# Patient Record
Sex: Male | Born: 1945 | Race: White | Hispanic: No | Smoking: Former smoker
Health system: Southern US, Community
[De-identification: ages and names within clinical notes are randomized; demographics above are authoritative.]

## PROBLEM LIST (undated history)

## (undated) DIAGNOSIS — I219 Acute myocardial infarction, unspecified: Secondary | ICD-10-CM

## (undated) DIAGNOSIS — K219 Gastro-esophageal reflux disease without esophagitis: Secondary | ICD-10-CM

## (undated) DIAGNOSIS — I4891 Unspecified atrial fibrillation: Secondary | ICD-10-CM

## (undated) DIAGNOSIS — I10 Essential (primary) hypertension: Secondary | ICD-10-CM

## (undated) DIAGNOSIS — E119 Type 2 diabetes mellitus without complications: Secondary | ICD-10-CM

## (undated) DIAGNOSIS — F17200 Nicotine dependence, unspecified, uncomplicated: Secondary | ICD-10-CM

## (undated) DIAGNOSIS — I6529 Occlusion and stenosis of unspecified carotid artery: Secondary | ICD-10-CM

## (undated) DIAGNOSIS — I2510 Atherosclerotic heart disease of native coronary artery without angina pectoris: Secondary | ICD-10-CM

## (undated) HISTORY — PX: CARDIAC CATHETERIZATION: SHX172

## (undated) HISTORY — DX: Unspecified atrial fibrillation: I48.91

---
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
## 2005-08-10 ENCOUNTER — Ambulatory Visit: Payer: Self-pay | Admitting: Family Medicine

## 2005-09-29 ENCOUNTER — Ambulatory Visit: Payer: Self-pay | Admitting: Family Medicine

## 2005-10-09 ENCOUNTER — Ambulatory Visit: Payer: Self-pay | Admitting: Gastroenterology

## 2005-10-27 ENCOUNTER — Ambulatory Visit: Payer: Self-pay | Admitting: Gastroenterology

## 2007-10-09 ENCOUNTER — Encounter: Payer: Self-pay | Admitting: Family Medicine

## 2008-03-12 ENCOUNTER — Telehealth: Payer: Self-pay | Admitting: *Deleted

## 2008-03-20 ENCOUNTER — Telehealth: Payer: Self-pay | Admitting: Family Medicine

## 2008-04-14 ENCOUNTER — Ambulatory Visit: Payer: Self-pay | Admitting: Family Medicine

## 2008-04-14 DIAGNOSIS — F17200 Nicotine dependence, unspecified, uncomplicated: Secondary | ICD-10-CM | POA: Insufficient documentation

## 2008-04-14 DIAGNOSIS — E1159 Type 2 diabetes mellitus with other circulatory complications: Secondary | ICD-10-CM | POA: Insufficient documentation

## 2008-04-14 DIAGNOSIS — I6529 Occlusion and stenosis of unspecified carotid artery: Secondary | ICD-10-CM | POA: Insufficient documentation

## 2008-04-14 DIAGNOSIS — I10 Essential (primary) hypertension: Secondary | ICD-10-CM | POA: Insufficient documentation

## 2008-04-14 DIAGNOSIS — E785 Hyperlipidemia, unspecified: Secondary | ICD-10-CM | POA: Insufficient documentation

## 2008-04-14 HISTORY — DX: Occlusion and stenosis of unspecified carotid artery: I65.29

## 2008-04-14 HISTORY — DX: Nicotine dependence, unspecified, uncomplicated: F17.200

## 2008-04-14 LAB — CONVERTED CEMR LAB
ALT: 26 units/L (ref 0–53)
Albumin: 4.3 g/dL (ref 3.5–5.2)
BUN: 28 mg/dL — ABNORMAL HIGH (ref 6–23)
Basophils Relative: 0.3 % (ref 0.0–3.0)
Bilirubin, Direct: 0.2 mg/dL (ref 0.0–0.3)
CO2: 33 meq/L — ABNORMAL HIGH (ref 19–32)
Calcium: 10 mg/dL (ref 8.4–10.5)
Creatinine, Ser: 0.9 mg/dL (ref 0.4–1.5)
Eosinophils Relative: 1.2 % (ref 0.0–5.0)
GFR calc Af Amer: 110 mL/min
Glucose, Bld: 116 mg/dL — ABNORMAL HIGH (ref 70–99)
HCT: 45.3 % (ref 39.0–52.0)
Hemoglobin: 16.1 g/dL (ref 13.0–17.0)
Lymphocytes Relative: 24.9 % (ref 12.0–46.0)
Monocytes Absolute: 0.5 10*3/uL (ref 0.1–1.0)
Monocytes Relative: 5.3 % (ref 3.0–12.0)
Neutro Abs: 6.1 10*3/uL (ref 1.4–7.7)
PSA: 0.66 ng/mL (ref 0.10–4.00)
RBC: 4.86 M/uL (ref 4.22–5.81)
RDW: 11.7 % (ref 11.5–14.6)
TSH: 2.02 microintl units/mL (ref 0.35–5.50)
Total Protein: 7.1 g/dL (ref 6.0–8.3)

## 2008-05-05 ENCOUNTER — Ambulatory Visit: Payer: Self-pay | Admitting: Cardiology

## 2008-07-06 ENCOUNTER — Encounter: Payer: Self-pay | Admitting: Family Medicine

## 2011-01-17 NOTE — Assessment & Plan Note (Signed)
[facility]                            CARDIOLOGY OFFICE NOTE   NAME:Daniel [name], Daniel [name]                      MRN:          [id number]  DATE:05/05/2008                            DOB:          10/06/1945    PRIMARY CARE PHYSICIAN:  Tinnie Gens A. Tawanna Cooler, MD   REASON FOR PRESENTATION:  Evaluate the patient with carotid stenosis.   HISTORY OF PRESENT ILLNESS:  The patient is a 65 year old gentleman who  was seen in the past by Dr. Andee Lineman for hypertension and management of  cardiovascular risk factors.  He had been absent from Dr. Nelida Meuse care  for a while because of lack of insurance.  However, he presented  recently after a health fair.  He apparently had some mild carotid  plaque demonstrated on a carotid Doppler as part of his health fair.  He  states that his ABIs were normal.  He stated he did not have an  abdominal aneurysm.  His cholesterol by his report was okay.  He saw  Dr. Tawanna Cooler for routine followup and was referred here.  He has significant  cardiovascular risk factors.   The patient has had stress test in the past.  His last one was 3 years  ago at [facility] and he stated it was normal.  There was an exercise  treadmill test.  He has never been diagnosed with coronary artery  disease.  He has had hypertension managed.  He has continued to smoke a  couple of cigars a day.  He states that he works vigorously at his job.  He exercises 3 days a week on a treadmill.  With this level of activity,  he denies any chest discomfort, neck or arm discomfort.  He has not had  any palpitations, presyncope, or syncope.  He has had no PND or  orthopnea.   PAST MEDICAL HISTORY:  Hypertension x15 years.   PAST SURGICAL HISTORY:  Skin graft as a child.   ALLERGIES:  PENICILLIN.   MEDICATIONS:  1. Amlodipine 5 mg daily.  2. Benazepril 20 mg daily.  3. Pravastatin 20 mg daily.  4. Atenolol/chlorthalidone 50/25 daily.  5. Aspirin 325 mg daily.  6.  Multivitamin.   SOCIAL HISTORY:  The patient is a widower.  He has 2 stepchildren and 3  grandchildren.  He works in a Pension scheme manager.  He smokes cigars, but no  cigarettes.  He does not drink alcohol.   FAMILY HISTORY:  Very remarkable for early coronary artery disease.  His  father had his first heart attack at age 79 and died with heart disease  at 50.  He has had 3 brothers with heart disease in their late 48s or  early 48s.   REVIEW OF SYSTEMS:  As stated in the HPI and otherwise negative for all  other systems.   PHYSICAL EXAMINATION:  GENERAL:  The patient is pleasant and in no  distress.  VITAL SIGNS:  Heart rate 62 and regular.  HEENT:  Eyes unremarkable; pupils equal, round, and reactive to light;  fundi within normal limits; oral mucosa unremarkable.  NECK:  No jugular venous distention at 45 degrees; carotid upstroke  brisk and symmetric; no bruits, no thyromegaly.  LYMPHATICS:  No cervical, axillary, or inguinal adenopathy.  LUNGS:  Clear to auscultation bilaterally.  BACK:  No costovertebral angle tenderness.  CHEST:  Unremarkable.  HEART:  PMI not displaced or sustained; S1 and S2 within normal limits;  no S3, no S4; no clicks, no rubs, no murmurs.  ABDOMEN:  Mildly obese; positive bowel sounds, normal in frequency and  pitch; no bruits, no rebound, no guarding, no midline pulsatile mass; no  thyromegaly, no splenomegaly.  SKIN:  No rashes, no nodules.  EXTREMITIES:  Pulses 2+ throughout; no edema, no cyanosis, no clubbing.  NEUROLOGIC:  Oriented to person, place, and time; cranial nerves II  through XII grossly intact; motor grossly intact throughout.   LABORATORY DATA:  EKG sinus bradycardia, rate 51, axis within normal  limits, intervals within normal limits, no acute ST-T wave changes.   ASSESSMENT AND PLAN:  1. Carotid stenosis.  The patient did have mild carotid stenosis by      Doppler at a health fair.  I believe this to be true as I find them       fairly accurate.  I would not suggest that he has any severe      disease.  In this situation and the absence of physical findings      suggesting high-grade disease, I would pursue a followup Doppler in      1 year.  He should have aggressive risk reduction given his risk      factors.  2. Dyslipidemia.  I have suggested to him that he needs to have a      lipid profile that include a breakdown of the HDL and LDL.  With      his family history, his LDL should be less than a 829 and HDL      ideally greater than 40.  He will try to find these results for me      and we will review them.  3. Hypertension.  Blood pressure is controlled and he should continue      the medications as listed.  4. Obesity.  The patient has lost about 60 pounds through dieting over      the last year or so.  This is a wonderful effort and he is going to      continue this.  We discussed an appropriate exercise regimen as      well.  At this point, I do not think he needs further stress      testing, but would repeat an exercise treadmill test in no less      than 2 years as it would have been 5 years since the most recent.      I would do this because of his family history.  5. Followup.  We will see the patient back in 1 year for the carotid      Dopplers and in 2 years for the POET (plain old exercise      treadmill), unless he has any further symptoms or suggestion      of progressive cardiovascular problems.  I agree with Dr. Tawanna Cooler that      he needs to stop smoking.     Rollene Rotunda, MD, [facility]  Electronically Signed    JH/MedQ  DD: 05/05/2008  DT: 05/06/2008  Job #: [id number]   cc:   Tinnie Gens A. Tawanna Cooler, MD

## 2011-09-05 DIAGNOSIS — I219 Acute myocardial infarction, unspecified: Secondary | ICD-10-CM

## 2011-09-05 HISTORY — PX: CORONARY ARTERY BYPASS GRAFT: SHX141

## 2011-09-05 HISTORY — DX: Acute myocardial infarction, unspecified: I21.9

## 2011-12-01 ENCOUNTER — Other Ambulatory Visit: Payer: Self-pay

## 2011-12-01 ENCOUNTER — Encounter (HOSPITAL_COMMUNITY)
Admission: EM | Disposition: A | Discharge status: Home / Self Care | Payer: Self-pay | Source: Home / Self Care | Attending: Cardiology

## 2011-12-01 ENCOUNTER — Inpatient Hospital Stay (HOSPITAL_COMMUNITY)
Admission: EM | Admit: 2011-12-01 | Discharge: 2011-12-05 | DRG: 251 | Disposition: A | Discharge status: Home / Self Care | Payer: Medicare Other | Attending: Cardiology | Admitting: Cardiology

## 2011-12-01 ENCOUNTER — Encounter (HOSPITAL_COMMUNITY): Payer: Self-pay | Admitting: Nurse Practitioner

## 2011-12-01 ENCOUNTER — Emergency Department (HOSPITAL_COMMUNITY): Payer: Medicare Other

## 2011-12-01 DIAGNOSIS — E669 Obesity, unspecified: Secondary | ICD-10-CM

## 2011-12-01 DIAGNOSIS — F17200 Nicotine dependence, unspecified, uncomplicated: Secondary | ICD-10-CM | POA: Diagnosis present

## 2011-12-01 DIAGNOSIS — I2510 Atherosclerotic heart disease of native coronary artery without angina pectoris: Secondary | ICD-10-CM

## 2011-12-01 DIAGNOSIS — D72829 Elevated white blood cell count, unspecified: Secondary | ICD-10-CM | POA: Diagnosis present

## 2011-12-01 DIAGNOSIS — K219 Gastro-esophageal reflux disease without esophagitis: Secondary | ICD-10-CM | POA: Diagnosis present

## 2011-12-01 DIAGNOSIS — I2119 ST elevation (STEMI) myocardial infarction involving other coronary artery of inferior wall: Secondary | ICD-10-CM

## 2011-12-01 DIAGNOSIS — I214 Non-ST elevation (NSTEMI) myocardial infarction: Principal | ICD-10-CM | POA: Diagnosis present

## 2011-12-01 DIAGNOSIS — I10 Essential (primary) hypertension: Secondary | ICD-10-CM | POA: Insufficient documentation

## 2011-12-01 DIAGNOSIS — Z720 Tobacco use: Secondary | ICD-10-CM

## 2011-12-01 DIAGNOSIS — I213 ST elevation (STEMI) myocardial infarction of unspecified site: Secondary | ICD-10-CM

## 2011-12-01 DIAGNOSIS — E119 Type 2 diabetes mellitus without complications: Secondary | ICD-10-CM | POA: Insufficient documentation

## 2011-12-01 DIAGNOSIS — E785 Hyperlipidemia, unspecified: Secondary | ICD-10-CM

## 2011-12-01 HISTORY — DX: Essential (primary) hypertension: I10

## 2011-12-01 HISTORY — DX: Atherosclerotic heart disease of native coronary artery without angina pectoris: I25.10

## 2011-12-01 HISTORY — DX: Gastro-esophageal reflux disease without esophagitis: K21.9

## 2011-12-01 HISTORY — DX: Occlusion and stenosis of unspecified carotid artery: I65.29

## 2011-12-01 HISTORY — DX: Type 2 diabetes mellitus without complications: E11.9

## 2011-12-01 HISTORY — DX: Nicotine dependence, unspecified, uncomplicated: F17.200

## 2011-12-01 HISTORY — PX: LEFT HEART CATHETERIZATION WITH CORONARY ANGIOGRAM: SHX5451

## 2011-12-01 HISTORY — PX: PERCUTANEOUS CORONARY INTERVENTION-BALLOON ONLY: SHX6014

## 2011-12-01 HISTORY — DX: Acute myocardial infarction, unspecified: I21.9

## 2011-12-01 LAB — BASIC METABOLIC PANEL
BUN: 27 mg/dL — ABNORMAL HIGH (ref 6–23)
Calcium: 10.5 mg/dL (ref 8.4–10.5)
Creatinine, Ser: 1.06 mg/dL (ref 0.50–1.35)
GFR calc Af Amer: 83 mL/min — ABNORMAL LOW (ref >90)

## 2011-12-01 LAB — CBC
HCT: 45.9 % (ref 39.0–52.0)
Hemoglobin: 15.8 g/dL (ref 13.0–17.0)
MCH: 32.3 pg (ref 26.0–34.0)
MCH: 31.9 pg (ref 26.0–34.0)
MCHC: 35 g/dL (ref 30.0–36.0)
MCHC: 34.4 g/dL (ref 30.0–36.0)
MCV: 92.3 fL (ref 78.0–100.0)
Platelets: 247 10*3/uL (ref 150–400)
RBC: 4.96 MIL/uL (ref 4.22–5.81)
RDW: 12.2 % (ref 11.5–15.5)

## 2011-12-01 LAB — MRSA PCR SCREENING: MRSA by PCR: NEGATIVE

## 2011-12-01 LAB — CARDIAC PANEL(CRET KIN+CKTOT+MB+TROPI)
CK, MB: 8.4 ng/mL (ref 0.3–4.0)
Total CK: 164 U/L (ref 7–232)

## 2011-12-01 LAB — PROTIME-INR: Prothrombin Time: 13.1 seconds (ref 11.6–15.2)

## 2011-12-01 SURGERY — LEFT HEART CATHETERIZATION WITH CORONARY ANGIOGRAM
Anesthesia: LOCAL

## 2011-12-01 MED ORDER — NITROGLYCERIN 0.2 MG/ML ON CALL CATH LAB
INTRAVENOUS | Status: AC
  Filled 2011-12-01: qty 1

## 2011-12-01 MED ORDER — ALPRAZOLAM 0.25 MG PO TABS
0.2500 mg | ORAL_TABLET | Freq: Two times a day (BID) | ORAL | Status: DC | PRN
  Administered 2011-12-01 – 2011-12-04 (×5): 0.25 mg via ORAL
  Filled 2011-12-01 (×6): qty 1

## 2011-12-01 MED ORDER — NITROGLYCERIN 0.4 MG SL SUBL: 0.4000 mg | SUBLINGUAL_TABLET | SUBLINGUAL | Status: DC | PRN

## 2011-12-01 MED ORDER — ENOXAPARIN SODIUM 40 MG/0.4ML ~~LOC~~ SOLN
40.0000 mg | SUBCUTANEOUS | Status: DC
  Filled 2011-12-01: qty 0.4

## 2011-12-01 MED ORDER — ACETAMINOPHEN 325 MG PO TABS: 650.0000 mg | ORAL_TABLET | ORAL | Status: DC | PRN

## 2011-12-01 MED ORDER — INSULIN ASPART 100 UNIT/ML ~~LOC~~ SOLN
0.0000 [IU] | Freq: Three times a day (TID) | SUBCUTANEOUS | Status: DC
  Administered 2011-12-02: 2 [IU] via SUBCUTANEOUS
  Administered 2011-12-02 – 2011-12-03 (×2): 3 [IU] via SUBCUTANEOUS
  Administered 2011-12-04 (×2): 2 [IU] via SUBCUTANEOUS

## 2011-12-01 MED ORDER — LABETALOL HCL 5 MG/ML IV SOLN
INTRAVENOUS | Status: AC
  Administered 2011-12-01: 10 mg via INTRAVENOUS
  Filled 2011-12-01: qty 4

## 2011-12-01 MED ORDER — MORPHINE SULFATE 2 MG/ML IJ SOLN
2.0000 mg | INTRAMUSCULAR | Status: DC | PRN
  Administered 2011-12-01 – 2011-12-02 (×2): 2 mg via INTRAVENOUS
  Filled 2011-12-01 (×2): qty 1

## 2011-12-01 MED ORDER — METOPROLOL TARTRATE 1 MG/ML IV SOLN
5.0000 mg | Freq: Once | INTRAVENOUS | Status: AC
  Administered 2011-12-01: 5 mg via INTRAVENOUS

## 2011-12-01 MED ORDER — HEPARIN (PORCINE) IN NACL 2-0.9 UNIT/ML-% IJ SOLN
INTRAMUSCULAR | Status: AC
  Filled 2011-12-01: qty 2000

## 2011-12-01 MED ORDER — NITROGLYCERIN 0.4 MG SL SUBL
0.4000 mg | SUBLINGUAL_TABLET | SUBLINGUAL | Status: DC | PRN
  Administered 2011-12-01: 0.4 mg via SUBLINGUAL
  Filled 2011-12-01: qty 25

## 2011-12-01 MED ORDER — SODIUM CHLORIDE 0.9 % IJ SOLN: 3.0000 mL | INTRAMUSCULAR | Status: DC | PRN

## 2011-12-01 MED ORDER — FENTANYL CITRATE 0.05 MG/ML IJ SOLN
INTRAMUSCULAR | Status: AC
  Filled 2011-12-01: qty 2

## 2011-12-01 MED ORDER — LIDOCAINE HCL (PF) 1 % IJ SOLN
INTRAMUSCULAR | Status: AC
  Filled 2011-12-01: qty 30

## 2011-12-01 MED ORDER — BIVALIRUDIN 250 MG IV SOLR
INTRAVENOUS | Status: AC
  Filled 2011-12-01: qty 250

## 2011-12-01 MED ORDER — ZOLPIDEM TARTRATE 5 MG PO TABS
5.0000 mg | ORAL_TABLET | Freq: Every evening | ORAL | Status: DC | PRN
  Administered 2011-12-02 – 2011-12-04 (×3): 5 mg via ORAL
  Filled 2011-12-01 (×3): qty 1

## 2011-12-01 MED ORDER — NITROGLYCERIN IN D5W 200-5 MCG/ML-% IV SOLN
2.0000 ug/min | INTRAVENOUS | Status: AC
  Administered 2011-12-01: 10 ug/min via INTRAVENOUS

## 2011-12-01 MED ORDER — LABETALOL HCL 5 MG/ML IV SOLN
10.0000 mg | Freq: Once | INTRAVENOUS | Status: AC
  Administered 2011-12-01: 10 mg via INTRAVENOUS

## 2011-12-01 MED ORDER — ATROPINE SULFATE 1 MG/ML IJ SOLN
INTRAMUSCULAR | Status: AC
  Filled 2011-12-01: qty 1

## 2011-12-01 MED ORDER — MIDAZOLAM HCL 2 MG/2ML IJ SOLN
INTRAMUSCULAR | Status: AC
  Filled 2011-12-01: qty 2

## 2011-12-01 MED ORDER — ONDANSETRON HCL 4 MG/2ML IJ SOLN
4.0000 mg | Freq: Four times a day (QID) | INTRAMUSCULAR | Status: DC | PRN
  Filled 2011-12-01: qty 2

## 2011-12-01 MED ORDER — ASPIRIN 81 MG PO CHEW: 81.0000 mg | CHEWABLE_TABLET | Freq: Every day | ORAL | Status: DC

## 2011-12-01 MED ORDER — ATORVASTATIN CALCIUM 80 MG PO TABS
80.0000 mg | ORAL_TABLET | Freq: Every day | ORAL | Status: DC
  Administered 2011-12-02 – 2011-12-04 (×3): 80 mg via ORAL
  Filled 2011-12-01 (×4): qty 1

## 2011-12-01 MED ORDER — SODIUM CHLORIDE 0.9 % IJ SOLN
3.0000 mL | Freq: Two times a day (BID) | INTRAMUSCULAR | Status: DC
  Administered 2011-12-01 – 2011-12-02 (×2): 3 mL via INTRAVENOUS
  Administered 2011-12-03 (×2): via INTRAVENOUS
  Administered 2011-12-04 – 2011-12-05 (×3): 3 mL via INTRAVENOUS

## 2011-12-01 MED ORDER — ASPIRIN EC 81 MG PO TBEC
81.0000 mg | DELAYED_RELEASE_TABLET | Freq: Every day | ORAL | Status: DC
  Administered 2011-12-02 – 2011-12-05 (×4): 81 mg via ORAL
  Filled 2011-12-01 (×4): qty 1

## 2011-12-01 MED ORDER — HEPARIN (PORCINE) IN NACL 100-0.45 UNIT/ML-% IJ SOLN
INTRAMUSCULAR | Status: AC
  Filled 2011-12-01: qty 250

## 2011-12-01 MED ORDER — METOPROLOL TARTRATE 1 MG/ML IV SOLN
INTRAVENOUS | Status: AC
  Filled 2011-12-01: qty 5

## 2011-12-01 MED ORDER — SODIUM CHLORIDE 0.9 % IV SOLN
0.2500 mg/kg/h | INTRAVENOUS | Status: DC
  Administered 2011-12-01: 0.25 mg/kg/h via INTRAVENOUS
  Filled 2011-12-01 (×2): qty 250

## 2011-12-01 MED ORDER — SODIUM CHLORIDE 0.9 % IV SOLN: 250.0000 mL | INTRAVENOUS | Status: DC | PRN

## 2011-12-01 MED ORDER — ONDANSETRON HCL 4 MG/2ML IJ SOLN: 4.0000 mg | Freq: Four times a day (QID) | INTRAMUSCULAR | Status: DC | PRN

## 2011-12-01 MED ORDER — HEPARIN BOLUS VIA INFUSION
4000.0000 [IU] | Freq: Once | INTRAVENOUS | Status: AC
  Administered 2011-12-01: 4000 [IU] via INTRAVENOUS

## 2011-12-01 MED ORDER — CARVEDILOL 3.125 MG PO TABS
3.1250 mg | ORAL_TABLET | Freq: Two times a day (BID) | ORAL | Status: DC
  Administered 2011-12-02 – 2011-12-03 (×3): 3.125 mg via ORAL
  Filled 2011-12-01 (×5): qty 1

## 2011-12-01 MED ORDER — ATORVASTATIN CALCIUM 80 MG PO TABS
80.0000 mg | ORAL_TABLET | ORAL | Status: AC
Start: 2011-12-01 — End: 2011-12-01
  Administered 2011-12-01: 80 mg via ORAL
  Filled 2011-12-01: qty 1

## 2011-12-01 MED ORDER — NITROGLYCERIN IN D5W 200-5 MCG/ML-% IV SOLN
INTRAVENOUS | Status: AC
  Filled 2011-12-01: qty 250

## 2011-12-01 MED ORDER — DIAZEPAM 2 MG PO TABS
2.0000 mg | ORAL_TABLET | Freq: Four times a day (QID) | ORAL | Status: DC | PRN
  Administered 2011-12-01: 2 mg via ORAL
  Filled 2011-12-01: qty 1

## 2011-12-01 MED ORDER — CLOPIDOGREL BISULFATE 300 MG PO TABS
ORAL_TABLET | ORAL | Status: AC
  Filled 2011-12-01: qty 2

## 2011-12-01 MED ORDER — NITROGLYCERIN IN D5W 200-5 MCG/ML-% IV SOLN
2.0000 ug/min | Freq: Once | INTRAVENOUS | Status: AC
  Administered 2011-12-01: 5 ug/min via INTRAVENOUS

## 2011-12-01 MED ORDER — METOPROLOL TARTRATE 1 MG/ML IV SOLN
INTRAVENOUS | Status: AC
  Administered 2011-12-01: 5 mg via INTRAVENOUS
  Filled 2011-12-01: qty 5

## 2011-12-01 MED ORDER — ASPIRIN 325 MG PO TABS: 325.0000 mg | ORAL_TABLET | ORAL | Status: DC

## 2011-12-01 MED ORDER — CARVEDILOL 3.125 MG PO TABS
3.1250 mg | ORAL_TABLET | ORAL | Status: AC
  Administered 2011-12-01: 3.125 mg via ORAL
  Filled 2011-12-01: qty 1

## 2011-12-01 NOTE — H&P (Signed)
Patient ID: Irene Pap MRN: [id number], DOB/AGE: 1946-03-31   Admit date: 12/01/2011   Primary Physician: Lucina Mellow, MD - Urgent Care Primary Cardiologist: T. Riley Kill, MD  Pt. Profile:  66 y/o male w/o prior cardiac history who presents with acute inferior STEMI.  Problem List  Past Medical History  Diagnosis Date  . HTN (hypertension)   . Diabetes mellitus type 2, noninsulin dependent     a. diagnosed 2012    Allergies  Allergies  Allergen Reactions  . Penicillins     HPI  66 y/o male with the above problem list.  He has no personal h/o CAD but has signif FH and RF including obesity, tobacco abuse, htn, and DM.  He was in his USOH until this afternoon, approx 1 hr ago, when while sitting and watching TV, he developed severe sscp and dyspnea.  He took 2 ASA 325mg  tabs and drove himself to the [facility] ED.  There, he was found to have significant ST elevation in inferior leads along with V3-4.  Code STEMI was called.  Pt was treated with Heparin 4K Unit bolus followed by infusion and IV NTG.  He was taken emergently to the [facility] cath lab where he cont to have 2/10 chest discomfort.  Emergent cath is ongoing.  Home Medications  ASA 325mg  Daily Lisinopril ? Dose Daily Metformin ? Dose Daily  Family History  Family History  Problem Relation Age of Onset  . Heart attack Father     died @ 72's  . Heart attack Mother     died @ 49's  . Heart attack Brother     died @ 33  . Heart attack Brother     died @ 54   Social History  History   Social History  . Marital Status: Unknown    Spouse Name: N/A    Number of Children: N/A  . Years of Education: N/A   Occupational History  . Not on file.   Social History Main Topics  . Smoking status: Current Everyday Smoker    Types: Cigars  . Smokeless tobacco: Not on file   Comment: smokes 1 cigar/day for many years  . Alcohol Use: No  . Drug Use: No  . Sexually Active: Not Currently   Other Topics Concern  .  Not on file   Social History Narrative   Pt lives by himself in [place].  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  He does not routinely exercise or adhere to any particular diet.     Review of Systems General:  No chills, fever, night sweats or weight changes.  Cardiovascular:  +++chest pain & dyspnea.  No edema, orthopnea, palpitations, paroxysmal nocturnal dyspnea. Dermatological: No rash, lesions/masses Respiratory: No cough, dyspnea Urologic: No hematuria, dysuria Abdominal:   No nausea, vomiting, diarrhea, bright red blood per rectum, melena, or hematemesis Neurologic:  No visual changes, wkns, changes in mental status. All other systems reviewed and are otherwise negative except as noted above.  Physical Exam  Blood pressure 187/111, pulse 101, temperature 98.6 F (37 C), temperature source Oral, resp. rate 20, height 6' (1.829 m), weight 264 lb 8.8 oz (120 kg), SpO2 97.00%.  General: Pleasant, NAD Psych: Normal affect. Neuro: Alert and oriented X 3. Moves all extremities spontaneously. HEENT: Normal  Neck: Supple without bruits or JVD. Lungs:  Resp regular and unlabored, CTA. Heart: RRR no s3, s4, or murmurs. Abdomen: Soft, non-tender, non-distended, BS + x 4.  Extremities: No clubbing, cyanosis or edema. DP/PT/Radials  2+ and equal bilaterally.  Labs  Lab Results  Component Value Date   WBC 14.6* 12/01/2011   HGB 17.3* 12/01/2011   HCT 49.4 12/01/2011   MCV 92.3 12/01/2011   PLT 247 12/01/2011    Lab Results  Component Value Date   CREATININE 1.06 12/01/2011    Radiology/Studies  No results found.  ECG  RSR, 98, 2-61mm st elevation in II, III, aVG, V3, V4, st dep I, aVL  ASSESSMENT AND PLAN  1.  Acute Inf STEMI:  Undergoing emergent cath at this time.  Plan to add bb, statin, asa, +/- P2Y12 inhibitor pending angiography.  Eventual cardiac rehab.  2.  HTN:  Inadequately controlled in setting of #1.  Add bb, follow.  3.  Lipids:  Status unknown.  Check lipids/lft's.  Add high  dose statin.  4.  Tob Abuse:  Will need cessation counseling.  Signed, Nicolasa Ducking, NP 12/01/2011, 5:10 PM  Patient with onset of substernal chest pain and ST elevation inferiorly and some anteriorly.  Brought from [facility] to [facility] cath lab.  History reviewed while preparing for treatment.  Examination performed and ECG reviewed.  Labs reviewed real time.  Plan urgent cath.  I agree with note of CBURGE.    [name] 7:09 PM 12/01/2011

## 2011-12-01 NOTE — CV Procedure (Signed)
Cardiac Catheterization Procedure Note  Name: Daniel [name] MRN: [id number] DOB: [birth year]  Procedure: Placement of catheters without left heart cath, coronary angiography, and PTCA only of the RCA.   Indication: acute inferior wall MI   Diagnostic Procedure Details: The right groin was prepped, draped, and anesthetized with 1% lidocaine. Using the modified Seldinger technique, a 6 French sheath was introduced into the right femoral artery. Standard Judkins catheters were used for selective coronary angiography. Catheter exchanges were performed over a wire.  The diagnostic procedure was well-tolerated without immediate complications.  The RCA appeared to be a small caliber vessel, co dominant with the circumflex.  There was TIMI 1 flow and 2 high grade lesion.  We used a JR 4 with SH to cannulate, and bivalirudin was given per protocol.  Clopidogrel was also given.  The lesions were crossed with a traverse wire and then balloon dilatation done with a 2.0 balloon without much improvement.  We were able to reestablish flow but it recurrently deteriorated.  We used an over the wire balloon to get down, and a LUGE wire was placed along with a buddy wire.  Despite this, we were unable to pass a 2.0 mm stent distally.  We continued to do balloon dilatations, and while there was some flow, the areas would reclose.  We were going to attempt to pass a guideliner down the the vessel, but we abandoned that without attempt due to the vessel size.  We did a final dilatation, and at the end there was flow in a severly diseased vessel, but likely at high risk for closure.  However, we were approaching 40 minutes of fluoro time, and given the small distribution of the distal vessel, it was felt that further attempts should be abandoned as the patient will likely need CABG for his residual disease.  We showed the patient images throughout the course of the procedure.  He remained stable, the sheath was sewn in to  place, and he was taken to the CCU for sheath removal.    PROCEDURAL FINDINGS Hemodynamics: AO 172/111(140) LV not done  Coronary angiography: Coronary dominance: left  Left mainstem: Heavily calcified.  Not critically narrowed.    Left anterior descending (LAD): Courses to the apex.  Also calcified.  There is 70% narrowing in the LAD at the diagonal, and the diagonal has about 90% narrowing.  There is an 80-90% mid lesion with larger distal vessel, and the apical vessel appears diffusely narrow.  Both the diagonal and LAD appear graftable.    Left circumflex (LCx): There is a large ramus vessel that has ostial narrowing of 80% at its takeoff from the trifurcation.  The OM 1 has 80% proximal narrowing, and is a large vessel distally. There also appears to be a 70% or more stenosis leading into the PDA  (co dominant)  Right coronary artery (RCA): Non dominant vessel with a small codom PDA distally.  Shephard's crook takeoff proximally followed by a 95 % lesion at the prox mid junction, then a subtotal distal lesion with TIMI 1 flow.  At the completion of the procedure there was TIMI 3 flow with spasm throughout the small caliber vessel.  The lesions themselves were open with the proximal 50% with significant intimal disruption, and the distal with diffuse spasm distally, and at least 50-70% narrowing.  The potential for abrupt closure  (small vessel) was thought high, but the vessel was not amenable to either stenting or bypass. The amount of myocardium supplied was small.  Left ventriculography: Left ventricular systolic function is normal, LVEF is estimated at 55-65%, there is no significant mitral regurgitation      Final Conclusions:   1.  Inferior MI with anterior changes due to non dominant RCA with tandem lesions.   2.  PTCA only with inability to pass stents, and suboptimal final result but small distal vessel. 3.  Severe disease of the LAD and LCX that will require elective  revascularizationi.     Recommendations:  1.  Dr. Laneta Simmers has been called and will see tomorrow 2.  We will hold clopidogrel since no stent was placed, and surgery anticipated. 3.  Add medical therapy.    Daniel [name] 12/01/2011, 7:21 PM

## 2011-12-01 NOTE — ED Notes (Addendum)
Chest pain that began while watching TV associated with diaphoresis described as sternal pain that is burning. HX of DM, HTN, family hx of MI father, mother and 2 brothers. Pt took 2-325mg  asa at home prior to arrival.

## 2011-12-01 NOTE — ED Provider Notes (Signed)
History     CSN: [id number]  Arrival date & time 12/01/11  1557   First MD Initiated Contact with Patient 12/01/11 1610      Chief Complaint  Patient presents with  . Chest Pain    (Consider location/radiation/quality/duration/timing/severity/associated sxs/prior treatment) Patient is a 66 y.o. male presenting with chest pain. The history is provided by the patient. The history is limited by the condition of the patient (Patient presents with evidence of acute ST segment elevation myocardial infarction).  Chest Pain The chest pain began 1 - 2 hours ago. Duration of episode(s) is 1 hour. Chest pain occurs constantly. The chest pain is unchanged. The pain is associated with exertion. At its most intense, the pain is at 8/10. The pain is currently at 7/10. The severity of the pain is severe. The quality of the pain is described as dull ("like heartburn"). The pain does not radiate. Chest pain is worsened by exertion. Primary symptoms include fatigue and nausea. Pertinent negatives for primary symptoms include no fever, no syncope, no shortness of breath, no cough, no wheezing, no palpitations, no abdominal pain, no vomiting, no dizziness and no altered mental status.  Associated symptoms include diaphoresis.  Pertinent negatives for associated symptoms include no lower extremity edema, no near-syncope, no numbness, no orthopnea and no paroxysmal nocturnal dyspnea. He tried aspirin (The patient took 2 full-strength aspirin before coming to the emergency department) for the symptoms. Risk factors include obesity, male gender and smoking/tobacco exposure (Hyperlipidemia, hypertension, diabetes, no prior coronary artery disease or myocardial infarction known.).     No past medical history on file.  No past surgical history on file.  No family history on file.  History  Substance Use Topics  . Smoking status: Not on file  . Smokeless tobacco: Not on file  . Alcohol Use: Not on file       Review of Systems  Unable to perform ROS: Other  Constitutional: Positive for diaphoresis and fatigue. Negative for fever.  Respiratory: Negative for cough, shortness of breath and wheezing.   Cardiovascular: Positive for chest pain. Negative for palpitations, orthopnea, syncope and near-syncope.  Gastrointestinal: Positive for nausea. Negative for vomiting and abdominal pain.  Neurological: Negative for dizziness and numbness.  Psychiatric/Behavioral: Negative for altered mental status.    Allergies  Review of patient's allergies indicates not on file.  Home Medications  No current outpatient prescriptions on file.  BP 187/111  Pulse 101  Temp(Src) 98.6 F (37 C) (Oral)  Resp 20  SpO2 97%  Physical Exam  Nursing note and vitals reviewed. Constitutional: He is oriented to person, place, and time. He appears well-developed and well-nourished. He appears distressed.  HENT:  Head: Normocephalic and atraumatic.  Mouth/Throat: Oropharynx is clear and moist.  Eyes: EOM are normal. Pupils are equal, round, and reactive to light.  Neck: Normal range of motion. Neck supple. No JVD present. No tracheal deviation present.  Cardiovascular: Regular rhythm, S1 normal, S2 normal, normal heart sounds and intact distal pulses.   No extrasystoles are present. Tachycardia present.  PMI is not displaced.  Exam reveals no gallop and no friction rub.   No murmur heard. Pulmonary/Chest: Effort normal and breath sounds normal. No accessory muscle usage or stridor. Not tachypneic. No respiratory distress. He has no decreased breath sounds. He has no wheezes. He has no rhonchi. He has no rales. He exhibits no tenderness, no bony tenderness, no crepitus and no retraction.  Abdominal: Soft. Bowel sounds are normal. He exhibits no  distension and no mass. There is no tenderness. There is no rebound and no guarding.  Musculoskeletal: Normal range of motion. He exhibits no edema and no tenderness.   Neurological: He is alert and oriented to person, place, and time. No cranial nerve deficit. He exhibits normal muscle tone.  Skin: Skin is warm. No rash noted. He is diaphoretic. No erythema. No pallor.  Psychiatric:       Anxious    ED Course  Procedures (including critical care time)   Date: 12/01/2011  Rate: 98  Rhythm: normal sinus rhythm  QRS Axis: normal  Intervals: normal  ST/T Wave abnormalities: ST elevations inferiorly and ST elevations anteriorly  Conduction Disutrbances:none  Narrative Interpretation: Acute ST elevation myocardial infarction  Old EKG Reviewed: none available    Labs Reviewed  CBC  BASIC METABOLIC PANEL  PROTIME-INR  APTT   No results found.   1. STEMI (ST elevation myocardial infarction)       MDM  Upon seeing the patient's electrocardiogram showing STEMI in evaluating the patient giving the appearance of the patient experiencing a myocardial infarction, with moderate hypertension, labor and for nitroglycerin, I started initial measures for treatment of STEMI including heparin, nitroglycerin, administration of metoprolol, oxygen, and the patient has taken aspirin. A code STEMI was called and care length has arrived to transport the patient emergently to the cardiac cath lab at [facility]. Dr. Daleen Squibb cardiologist here at [facility] long has presented to the ED, briefly evaluated the patient, and will communicate the information to the cardiac cath team at [facility].  I have communicated to the patient that we believe he is having a myocardial infarction and then we will emergently transport him to [facility] cath lab for treatment. He states his understanding of and agreement with the plan of care.        Felisa Bonier, MD 12/01/11 1620

## 2011-12-01 NOTE — ED Provider Notes (Addendum)
Patient seen and evaluated briefly.  Acute onset of substernal chest pain 30 minutes ago while watching TV.  Associated with SOB, nausea, diaphoresis.  Had some intermittent burning pain last night he attributed to "heartburn".  No previous cardiac history.  +HTN, HLD, DM  ST elevation II, III, aVF, v3, v4 ST depressions I, aVL  Code STEMI activated at 1603. Care transferred to Dr. Fredricka Bonine.   Date: 12/01/2011  Rate: 98  Rhythm: normal sinus rhythm  QRS Axis: normal  Intervals: normal  ST/T Wave abnormalities: ST elevations inferiorly and ST elevations anteriorly  Conduction Disutrbances:none  Narrative Interpretation:   Old EKG Reviewed: none available    Glynn Octave, MD 12/01/11 1612  Glynn Octave, MD 12/01/11 [phone]

## 2011-12-01 NOTE — Progress Notes (Signed)
  Echocardiogram 2D Echocardiogram has been performed.  [place], Real Cons 12/01/2011, 10:15 PM

## 2011-12-01 NOTE — Progress Notes (Addendum)
Patient stable and without any chest pain at present.  He is not short of breath.  Films reviewed with Dr. Maryelizabeth Kaufmann and Dr. [place] earlier.  No LCA occlusions.  ECG changes likely reflect RV branches   (V1 STE > V2 STE> V3 STE) from RCA. BP up but we have given him IV metoprolol and IV NTG.  Valium also given.  Hopefully will get down prior to sheath pull.   Echo at bedside.  LV looks vigorous, and TV annulus and RV look vigorous as well.  First set of enzymes only mildly elevated.     Daniel [name] 9:42 PM 12/01/2011

## 2011-12-01 NOTE — Interval H&P Note (Signed)
History and Physical Interval Note:  12/01/2011 7:10 PM  Daniel [name]  has presented today for surgery, with the diagnosis of stemi  The various methods of treatment have been discussed with the patient and family. After consideration of risks, benefits and other options for treatment, the patient has consented to  Procedure(s) (LRB): LEFT HEART CATHETERIZATION WITH CORONARY ANGIOGRAM (N/A) PERCUTANEOUS CORONARY INTERVENTION-BALLOON ONLY (N/A) as a surgical intervention .  The patients' history has been reviewed, patient examined, no change in status, stable for surgery.  I have reviewed the patients' chart and labs.  Questions were answered to the patient's satisfaction.     [name]  Patient done as an urgent case with STEMI.  Explained on arrival.

## 2011-12-02 ENCOUNTER — Encounter (HOSPITAL_COMMUNITY): Payer: Self-pay | Admitting: *Deleted

## 2011-12-02 DIAGNOSIS — I2510 Atherosclerotic heart disease of native coronary artery without angina pectoris: Secondary | ICD-10-CM

## 2011-12-02 LAB — LIPID PANEL
Cholesterol: 181 mg/dL (ref 0–200)
Total CHOL/HDL Ratio: 5 RATIO
Triglycerides: 134 mg/dL (ref <150)
VLDL: 27 mg/dL (ref 0–40)

## 2011-12-02 LAB — CBC
HCT: 41.8 % (ref 39.0–52.0)
Hemoglobin: 14.4 g/dL (ref 13.0–17.0)
RBC: 4.54 MIL/uL (ref 4.22–5.81)
RDW: 12.4 % (ref 11.5–15.5)
WBC: 15.8 10*3/uL — ABNORMAL HIGH (ref 4.0–10.5)

## 2011-12-02 LAB — COMPREHENSIVE METABOLIC PANEL
AST: 47 U/L — ABNORMAL HIGH (ref 0–37)
Albumin: 3.5 g/dL (ref 3.5–5.2)
Calcium: 9.5 mg/dL (ref 8.4–10.5)
Creatinine, Ser: 0.79 mg/dL (ref 0.50–1.35)
Sodium: 139 mEq/L (ref 135–145)
Total Protein: 6.1 g/dL (ref 6.0–8.3)

## 2011-12-02 LAB — CARDIAC PANEL(CRET KIN+CKTOT+MB+TROPI)
Relative Index: 8.4 — ABNORMAL HIGH (ref 0.0–2.5)
Relative Index: 9.3 — ABNORMAL HIGH (ref 0.0–2.5)
Total CK: 567 U/L — ABNORMAL HIGH (ref 7–232)
Troponin I: 7.3 ng/mL (ref <0.30)

## 2011-12-02 LAB — GLUCOSE, CAPILLARY
Glucose-Capillary: 109 mg/dL — ABNORMAL HIGH (ref 70–99)
Glucose-Capillary: 122 mg/dL — ABNORMAL HIGH (ref 70–99)
Glucose-Capillary: 147 mg/dL — ABNORMAL HIGH (ref 70–99)
Glucose-Capillary: 82 mg/dL (ref 70–99)

## 2011-12-02 LAB — HEMOGLOBIN A1C: Mean Plasma Glucose: 131 mg/dL — ABNORMAL HIGH (ref <117)

## 2011-12-02 MED ORDER — ENOXAPARIN SODIUM 40 MG/0.4ML ~~LOC~~ SOLN
40.0000 mg | SUBCUTANEOUS | Status: DC
  Administered 2011-12-02 – 2011-12-04 (×3): 40 mg via SUBCUTANEOUS
  Filled 2011-12-02 (×4): qty 0.4

## 2011-12-02 MED ORDER — LISINOPRIL 5 MG PO TABS
5.0000 mg | ORAL_TABLET | Freq: Every day | ORAL | Status: DC
  Administered 2011-12-02 – 2011-12-03 (×2): 5 mg via ORAL
  Filled 2011-12-02 (×2): qty 1

## 2011-12-02 NOTE — Progress Notes (Signed)
Subjective:  66 year old with acute inferior STEMI 12/01/11.  Feels better this am, no CP, no SOB Mild HA with NTG IV  Had discussion with family.   Objective:  Vital Signs in the last 24 hours: Temp:  [98.1 F (36.7 C)-98.8 F (37.1 C)] 98.8 F (37.1 C) (03/30 0740) Pulse Rate:  [69-102] 83  (03/30 0800) Resp:  [7-21] 20  (03/30 0800) BP: (96-187)/(53-111) 138/82 mmHg (03/30 0800) SpO2:  [93 %-100 %] 97 % (03/30 0800) Weight:  [117.7 kg (259 lb 7.7 oz)-122 kg (268 lb 15.4 oz)] 122 kg (268 lb 15.4 oz) (03/30 0630)  Intake/Output from previous day: 03/29 0701 - 03/30 0700 In: 781.5 [P.O.:480; I.V.:301.5] Out: 1450 [Urine:1450]   Physical Exam: General: Well developed, well nourished, in no acute distress. Head:  Normocephalic and atraumatic. Lungs: Clear to auscultation and percussion. Heart: Normal S1 and S2.  No murmur, rubs or gallops.  Pulses: Pulses normal in all 4 extremities. Abdomen: soft, non-tender, positive bowel sounds. Extremities: No clubbing or cyanosis. No edema. Cath site c/d/i Neurologic: Alert and oriented x 3.    Lab Results:  Basename 12/02/11 0806 12/01/11 2015  WBC 15.8* 14.3*  HGB 14.4 15.8  PLT 196 211    Basename 12/01/11 2015 12/01/11 1558  NA -- 141  K -- 4.2  CL -- 99  CO2 -- 30  GLUCOSE -- 175*  BUN -- 27*  CREATININE 0.88 1.06    Basename 12/02/11 0120 12/01/11 2015  TROPONINI 2.82* 1.02*    Telemetry: No VT Personally viewed.   EKG:  3/30 7am: NSR 76, 1mm ST elevation 2,3,F. No significant depression in 1 , L.   3/29: significant ST elevation inf and ant leads (stat ECHO as below) personally viewed.   Cardiac Studies:  ECHO: Left ventricle: The cavity size was normal. There was mild concentric hypertrophy. Systolic function was vigorous. The estimated ejection fraction was in the range of 65% to 70%. Wall motion was normal; there were no regional wall motion abnormalities. Doppler parameters are consistent  with abnormal left ventricular relaxation (grade 1 diastolic dysfunction).   Cath: 12/01/11  Coronary dominance: left   Left mainstem: Heavily calcified. Not critically narrowed.   Left anterior descending (LAD): Courses to the apex. Also calcified. There is 70% narrowing in the LAD at the diagonal, and the diagonal has about 90% narrowing. There is an 80-90% mid lesion with larger distal vessel, and the apical vessel appears diffusely narrow. Both the diagonal and LAD appear graftable.   Left circumflex (LCx): There is a large ramus vessel that has ostial narrowing of 80% at its takeoff from the trifurcation. The OM 1 has 80% proximal narrowing, and is a large vessel distally. There also appears to be a 70% or more stenosis leading into the PDA (co dominant)   Right coronary artery (RCA): Non dominant vessel with a small codom PDA distally. Shephard's crook takeoff proximally followed by a 95 % lesion at the prox mid junction, then a subtotal distal lesion with TIMI 1 flow. At the completion of the procedure there was TIMI 3 flow with spasm throughout the small caliber vessel. The lesions themselves were open with the proximal 50% with significant intimal disruption, and the distal with diffuse spasm distally, and at least 50-70% narrowing. The potential for abrupt closure (small vessel) was thought high, but the vessel was not amenable to either stenting or bypass. The amount of myocardium supplied was small.   Left ventriculography: Left ventricular systolic  function is normal, LVEF is estimated at 55-65%, there is no significant mitral regurgitation   Final Conclusions:  1. Inferior MI with anterior changes due to non dominant RCA with tandem lesions.  2. PTCA only with inability to pass stents, and suboptimal final result but small distal vessel.  3. Severe disease of the LAD and LCX that will require elective revascularization.   Recommendations:  1. Dr. Laneta Simmers has been called and will  see tomorrow  2. We will hold clopidogrel since no stent was placed, and surgery anticipated.  3. Add medical therapy.    Assessment/Plan:   STEMI  - inferior, as above, PTCA only to RCA non dom. Significant LAD and      Circ.  disease  - Trop 2.82, MB 25  - ECHO - normal EF  - Because of severe multivessel disease, Dr. Laneta Simmers will be seeing to discuss CABG revasc.   - Did get Plavix load during cath 3/29 at 7PM, no further dose. May need PLT inhibition study.   - Coreg 3.125 bid, atorvastatin 80, ASA 81, NTG IV, Enoxaparin  - Lipid panel pending  Tobacco  - cessation discussed  - cigars  DM  - monitor, just diagnosed.   - A1c pending  - ISS  Leukocytosis  - secondary to STEMI, 15.8  HTN  - was on lisinopril as outpt.   - will start 5mg .     Daniel [name] 12/02/2011, 9:14 AM

## 2011-12-02 NOTE — Consult Note (Signed)
[address]            [place] [address]          [id number]      Reason for Consult: Severe multi-vessel coronary occlusive disease Referring Physician: Dr. Emeline General Daniel [name] is an 66 y.o. male.  HPI:   The patient is a 66 year old gentleman with a significant family history of heart disease and multiple cardiac risk factors including obesity, cigar smoking, hypertension, and diabetes who was watching TV yesterday afternoon when he developed severe substernal chest discomfort and shortness of breath. He took 2 aspirin and drove himself to a [facility] emergency room where he was found to have significant inferior ST elevation and a code STEMI was called. He was taken to the Cath Lab and the culprit was found to be a subtotally occluded right coronary. Attempts to open this artery were unsuccessful. The patient was given a loading dose of Plavix. The catheterization also showed significant stenoses in the LAD and left circumflex territories. Since the right coronary artery was a small vessel with a small myocardial territory it was felt the procedure should be concluded and bypass surgery planned. He has had no further chest discomfort or shortness of breath.  Past Medical History  Diagnosis Date  . HTN (hypertension)   . Diabetes mellitus type 2, noninsulin dependent     a. diagnosed 2012  . Coronary artery disease   . Myocardial infarction   . Diabetes mellitus   . GERD (gastroesophageal reflux disease)     Past Surgical History  Procedure Date  . Cardiac catheterization     Family History  Problem Relation Age of Onset  . Heart attack Father     died @ 35's  . Heart attack Mother     died @ 53's  . Heart attack Brother     died @ 72  . Heart attack Brother     died @ 71    Social History:  reports that he has been smoking Cigars.  He does not have any smokeless tobacco history on file. He reports that he does not drink alcohol or  use illicit drugs.  Allergies:  Allergies  Allergen Reactions  . Penicillins     Medications:  I have reviewed the patient's current medications. Prior to Admission:  Prescriptions prior to admission  Medication Sig Dispense Refill  . aspirin EC 325 MG tablet Take 325 mg by mouth at bedtime.      Marland Kitchen lisinopril-hydrochlorothiazide (PRINZIDE,ZESTORETIC) 20-12.5 MG per tablet Take 1 tablet by mouth daily.      . metFORMIN (GLUCOPHAGE) 850 MG tablet Take 850 mg by mouth 2 (two) times daily with a meal.      . Multiple Vitamin (MULITIVITAMIN WITH MINERALS) TABS Take 1 tablet by mouth daily.       Scheduled:   . aspirin EC  81 mg Oral Daily  . atorvastatin  80 mg Oral q1800  . atorvastatin  80 mg Oral NOW  . carvedilol  3.125 mg Oral BID WC  . carvedilol  3.125 mg Oral NOW  . enoxaparin  40 mg Subcutaneous Q24H  . heparin      . insulin aspart  0-15 Units Subcutaneous TID WC  . labetalol  10 mg Intravenous Once  . lisinopril  5 mg Oral Daily  . metoprolol  5 mg Intravenous Once  .  nitroGLYCERIN      . sodium chloride  3 mL Intravenous Q12H  . DISCONTD: aspirin  81 mg Oral Daily  . DISCONTD: aspirin  325 mg Oral STAT  . DISCONTD: bivalirudin (ANGIOMAX) infusion 5 mg/mL (Cath Lab,ACS,PCI indication)  0.25 mg/kg/hr Intravenous To Cath  . DISCONTD: enoxaparin  40 mg Subcutaneous Q24H   Continuous:   . nitroGLYCERIN 10 mcg/min (12/02/11 0645)   NWG:NFAOZH chloride, acetaminophen, acetaminophen, ALPRAZolam, diazepam, morphine, nitroGLYCERIN, ondansetron (ZOFRAN) IV, ondansetron (ZOFRAN) IV, sodium chloride, zolpidem, DISCONTD: nitroGLYCERIN  Results for orders placed during the hospital encounter of 12/01/11 (from the past 48 hour(s))  CBC     Status: Abnormal   Collection Time   12/01/11  3:58 PM      Component Value Range Comment   WBC 14.6 (*) 4.0 - 10.5 (K/uL)    RBC 5.35  4.22 - 5.81 (MIL/uL)    Hemoglobin 17.3 (*) 13.0 - 17.0 (g/dL)    HCT 08.6  57.8 - 46.9 (%)    MCV  92.3  78.0 - 100.0 (fL)    MCH 32.3  26.0 - 34.0 (pg)    MCHC 35.0  30.0 - 36.0 (g/dL)    RDW 62.9  52.8 - 41.3 (%)    Platelets 247  150 - 400 (K/uL)   BASIC METABOLIC PANEL     Status: Abnormal   Collection Time   12/01/11  3:58 PM      Component Value Range Comment   Sodium 141  135 - 145 (mEq/L)    Potassium 4.2  3.5 - 5.1 (mEq/L)    Chloride 99  96 - 112 (mEq/L)    CO2 30  19 - 32 (mEq/L)    Glucose, Bld 175 (*) 70 - 99 (mg/dL)    BUN 27 (*) 6 - 23 (mg/dL)    Creatinine, Ser 2.44  0.50 - 1.35 (mg/dL)    Calcium 01.0  8.4 - 10.5 (mg/dL)    GFR calc non Af Amer 72 (*) >90 (mL/min)    GFR calc Af Amer 83 (*) >90 (mL/min)   PROTIME-INR     Status: Normal   Collection Time   12/01/11  3:58 PM      Component Value Range Comment   Prothrombin Time 13.1  11.6 - 15.2 (seconds)    INR 0.97  0.00 - 1.49    APTT     Status: Normal   Collection Time   12/01/11  3:58 PM      Component Value Range Comment   aPTT 27  24 - 37 (seconds)   POCT I-STAT TROPONIN I     Status: Normal   Collection Time   12/01/11  4:35 PM      Component Value Range Comment   Troponin i, poc 0.00  0.00 - 0.08 (ng/mL)    Comment 3            GLUCOSE, CAPILLARY     Status: Abnormal   Collection Time   12/01/11  7:34 PM      Component Value Range Comment   Glucose-Capillary 118 (*) 70 - 99 (mg/dL)   MRSA PCR SCREENING     Status: Normal   Collection Time   12/01/11  7:38 PM      Component Value Range Comment   MRSA by PCR NEGATIVE  NEGATIVE    CARDIAC PANEL(CRET KIN+CKTOT+MB+TROPI)     Status: Abnormal   Collection Time   12/01/11  8:15 PM  Component Value Range Comment   Total CK 164  7 - 232 (U/L)    CK, MB 8.4 (*) 0.3 - 4.0 (ng/mL)    Troponin I 1.02 (*) <0.30 (ng/mL)    Relative Index 5.1 (*) 0.0 - 2.5    CBC     Status: Abnormal   Collection Time   12/01/11  8:15 PM      Component Value Range Comment   WBC 14.3 (*) 4.0 - 10.5 (K/uL)    RBC 4.96  4.22 - 5.81 (MIL/uL)    Hemoglobin 15.8  13.0 -  17.0 (g/dL)    HCT 16.1  09.6 - 04.5 (%)    MCV 92.5  78.0 - 100.0 (fL)    MCH 31.9  26.0 - 34.0 (pg)    MCHC 34.4  30.0 - 36.0 (g/dL)    RDW 40.9  81.1 - 91.4 (%)    Platelets 211  150 - 400 (K/uL)   CREATININE, SERUM     Status: Abnormal   Collection Time   12/01/11  8:15 PM      Component Value Range Comment   Creatinine, Ser 0.88  0.50 - 1.35 (mg/dL)    GFR calc non Af Amer 88 (*) >90 (mL/min)    GFR calc Af Amer >90  >90 (mL/min)   GLUCOSE, CAPILLARY     Status: Abnormal   Collection Time   12/02/11 12:21 AM      Component Value Range Comment   Glucose-Capillary 147 (*) 70 - 99 (mg/dL)   CARDIAC PANEL(CRET KIN+CKTOT+MB+TROPI)     Status: Abnormal   Collection Time   12/02/11  1:20 AM      Component Value Range Comment   Total CK 296 (*) 7 - 232 (U/L)    CK, MB 24.8 (*) 0.3 - 4.0 (ng/mL) CRITICAL VALUE NOTED.  VALUE IS CONSISTENT WITH PREVIOUSLY REPORTED AND CALLED VALUE.   Troponin I 2.82 (*) <0.30 (ng/mL)    Relative Index 8.4 (*) 0.0 - 2.5    GLUCOSE, CAPILLARY     Status: Abnormal   Collection Time   12/02/11  7:38 AM      Component Value Range Comment   Glucose-Capillary 109 (*) 70 - 99 (mg/dL)   CARDIAC PANEL(CRET KIN+CKTOT+MB+TROPI)     Status: Abnormal   Collection Time   12/02/11  8:06 AM      Component Value Range Comment   Total CK 567 (*) 7 - 232 (U/L)    CK, MB 52.8 (*) 0.3 - 4.0 (ng/mL) CRITICAL VALUE NOTED.  VALUE IS CONSISTENT WITH PREVIOUSLY REPORTED AND CALLED VALUE.   Troponin I 7.30 (*) <0.30 (ng/mL)    Relative Index 9.3 (*) 0.0 - 2.5    COMPREHENSIVE METABOLIC PANEL     Status: Abnormal   Collection Time   12/02/11  8:06 AM      Component Value Range Comment   Sodium 139  135 - 145 (mEq/L)    Potassium 4.4  3.5 - 5.1 (mEq/L)    Chloride 101  96 - 112 (mEq/L)    CO2 30  19 - 32 (mEq/L)    Glucose, Bld 122 (*) 70 - 99 (mg/dL)    BUN 19  6 - 23 (mg/dL)    Creatinine, Ser 7.82  0.50 - 1.35 (mg/dL)    Calcium 9.5  8.4 - 10.5 (mg/dL)    Total Protein  6.1  6.0 - 8.3 (g/dL)    Albumin 3.5  3.5 - 5.2 (g/dL)  AST 47 (*) 0 - 37 (U/L)    ALT 23  0 - 53 (U/L)    Alkaline Phosphatase 49  39 - 117 (U/L)    Total Bilirubin 0.7  0.3 - 1.2 (mg/dL)    GFR calc non Af Amer >90  >90 (mL/min)    GFR calc Af Amer >90  >90 (mL/min)   HEMOGLOBIN A1C     Status: Abnormal   Collection Time   12/02/11  8:06 AM      Component Value Range Comment   Hemoglobin A1C 6.2 (*) <5.7 (%)    Mean Plasma Glucose 131 (*) <117 (mg/dL)   CBC     Status: Abnormal   Collection Time   12/02/11  8:06 AM      Component Value Range Comment   WBC 15.8 (*) 4.0 - 10.5 (K/uL)    RBC 4.54  4.22 - 5.81 (MIL/uL)    Hemoglobin 14.4  13.0 - 17.0 (g/dL)    HCT 30.8  65.7 - 84.6 (%)    MCV 92.1  78.0 - 100.0 (fL)    MCH 31.7  26.0 - 34.0 (pg)    MCHC 34.4  30.0 - 36.0 (g/dL)    RDW 96.2  95.2 - 84.1 (%)    Platelets 196  150 - 400 (K/uL)   LIPID PANEL     Status: Abnormal   Collection Time   12/02/11  8:06 AM      Component Value Range Comment   Cholesterol 181  0 - 200 (mg/dL)    Triglycerides 324  <150 (mg/dL)    HDL 36 (*) >40 (mg/dL)    Total CHOL/HDL Ratio 5.0      VLDL 27  0 - 40 (mg/dL)    LDL Cholesterol 102 (*) 0 - 99 (mg/dL)   GLUCOSE, CAPILLARY     Status: Abnormal   Collection Time   12/02/11 11:05 AM      Component Value Range Comment   Glucose-Capillary 122 (*) 70 - 99 (mg/dL)   GLUCOSE, CAPILLARY     Status: Abnormal   Collection Time   12/02/11  4:05 PM      Component Value Range Comment   Glucose-Capillary 180 (*) 70 - 99 (mg/dL)     No results found.  Review of Systems  Constitutional: Negative.   HENT: Negative.   Eyes: Negative.   Respiratory: Positive for shortness of breath. Negative for cough, hemoptysis and sputum production.   Cardiovascular: Positive for chest pain. Negative for palpitations, orthopnea, leg swelling and PND.  Gastrointestinal: Negative.   Genitourinary: Negative.   Musculoskeletal: Negative.   Skin: Negative.     Neurological: Negative.   Endo/Heme/Allergies: Negative.   Psychiatric/Behavioral: Negative.    Blood pressure 155/95, pulse 82, temperature 98.8 F (37.1 C), temperature source Oral, resp. rate 20, height 6' (1.829 m), weight 122 kg (268 lb 15.4 oz), SpO2 95.00%. Physical Exam  Constitutional: He is oriented to person, place, and time. He appears well-developed and well-nourished. No distress.  HENT:  Head: Normocephalic and atraumatic.  Mouth/Throat: Oropharynx is clear and moist.  Eyes: EOM are normal. Pupils are equal, round, and reactive to light.  Neck: No JVD present. No thyromegaly present.  Cardiovascular: Normal rate, regular rhythm, normal heart sounds and intact distal pulses.  Exam reveals no gallop and no friction rub.   No murmur heard. Respiratory: Effort normal. No respiratory distress. He has no wheezes. He has no rales.  GI: Soft. Bowel sounds are normal.  He exhibits no mass. There is no tenderness.  Musculoskeletal: He exhibits no edema.  Lymphadenopathy:    He has no cervical adenopathy.  Neurological: He is alert and oriented to person, place, and time. He has normal strength. No cranial nerve deficit or sensory deficit.  Skin: Skin is warm and dry.  Psychiatric: He has a normal mood and affect.    Cardiac Cath:  Diagnostic Procedure Details: The right groin was prepped, draped, and anesthetized with 1% lidocaine. Using the modified Seldinger technique, a 6 French sheath was introduced into the right femoral artery. Standard Judkins catheters were used for selective coronary angiography. Catheter exchanges were performed over a wire.  The diagnostic procedure was well-tolerated without immediate complications.  The RCA appeared to be a small caliber vessel, co dominant with the circumflex.  There was TIMI 1 flow and 2 high grade lesion.  We used a JR 4 with SH to cannulate, and bivalirudin was given per protocol.  Clopidogrel was also given.  The lesions were  crossed with a traverse wire and then balloon dilatation done with a 2.0 balloon without much improvement.  We were able to reestablish flow but it recurrently deteriorated.  We used an over the wire balloon to get down, and a LUGE wire was placed along with a buddy wire.  Despite this, we were unable to pass a 2.0 mm stent distally.  We continued to do balloon dilatations, and while there was some flow, the areas would reclose.  We were going to attempt to pass a guideliner down the the vessel, but we abandoned that without attempt due to the vessel size.  We did a final dilatation, and at the end there was flow in a severly diseased vessel, but likely at high risk for closure.  However, we were approaching 40 minutes of fluoro time, and given the small distribution of the distal vessel, it was felt that further attempts should be abandoned as the patient will likely need CABG for his residual disease.  We showed the patient images throughout the course of the procedure.  He remained stable, the sheath was sewn in to place, and he was taken to the CCU for sheath removal.    PROCEDURAL FINDINGS Hemodynamics: AO 172/111(140) LV not done  Coronary angiography: Coronary dominance: left  Left mainstem: Heavily calcified.  Not critically narrowed.     Left anterior descending (LAD): Courses to the apex.  Also calcified.  There is 70% narrowing in the LAD at the diagonal, and the diagonal has about 90% narrowing.  There is an 80-90% mid lesion with larger distal vessel, and the apical vessel appears diffusely narrow.  Both the diagonal and LAD appear graftable.     Left circumflex (LCx): There is a large ramus vessel that has ostial narrowing of 80% at its takeoff from the trifurcation.  The OM 1 has 80% proximal narrowing, and is a large vessel distally. There also appears to be a 70% or more stenosis leading into the PDA  (co dominant)  Right coronary artery (RCA): Non dominant vessel with a small codom  PDA distally.  Shephard's crook takeoff proximally followed by a 95 % lesion at the prox mid junction, then a subtotal distal lesion with TIMI 1 flow.  At the completion of the procedure there was TIMI 3 flow with spasm throughout the small caliber vessel.  The lesions themselves were open with the proximal 50% with significant intimal disruption, and the distal with diffuse spasm distally, and  at least 50-70% narrowing.  The potential for abrupt closure  (small vessel) was thought high, but the vessel was not amenable to either stenting or bypass. The amount of myocardium supplied was small.     Left ventriculography: Left ventricular systolic function is normal, LVEF is estimated at 55-65%, there is no significant mitral regurgitation      Final Conclusions:   1.  Inferior MI with anterior changes due to non dominant RCA with tandem lesions.    2.  PTCA only with inability to pass stents, and suboptimal final result but small distal vessel. 3.  Severe disease of the LAD and LCX that will require elective revascularizationi.    Assessment/Plan:  He has severe multivessel coronary occlusive disease, status post inferior ST elevation MI. I agree that coronary bypass graft surgery is his best treatment to prevent further ischemia or infarction. I discussed the operative procedure with him and his family including alternatives, benefits, and risks. He would like to proceed with surgery. He did receive a loading dose of Plavix and therefore we'll plan to wait at least 5 days to perform surgery. We will check a P2Y12 level to aid in operative planning. I told the patient that I will be out-of-town all next week but could get one of my partners do his surgery towards the end of next week. He would like to home if at all possible preoperatively because he does not want to wait for another 5 days in the hospital. He will discuss that further with cardiology.   Novalynn Branaman K 12/02/2011, 7:09 PM

## 2011-12-02 NOTE — Progress Notes (Signed)
CARDIAC REHAB PHASE I   PRE:  Rate/Rhythm: 84 SR  BP:  Supine: 138/92  Sitting:   Standing:    SaO2: 96 RA  MODE:  Ambulation: 425 ft   POST:  Rate/Rhythem: 97 SR  BP:  Supine:   Sitting: 137/90  Standing:    SaO2: 96 RA Pt ambulated quite well, quick pace and steady gait. Pt denied any leg pain, CP or SOB. Pt full of questions about what is happening. Pt's daughter was in the room during visit.  [id number]  Minus Liberty

## 2011-12-03 LAB — GLUCOSE, CAPILLARY
Glucose-Capillary: 101 mg/dL — ABNORMAL HIGH (ref 70–99)
Glucose-Capillary: 100 mg/dL — ABNORMAL HIGH (ref 70–99)

## 2011-12-03 LAB — CBC
Platelets: 178 10*3/uL (ref 150–400)
RBC: 4.51 MIL/uL (ref 4.22–5.81)
RDW: 12.3 % (ref 11.5–15.5)
WBC: 13.9 10*3/uL — ABNORMAL HIGH (ref 4.0–10.5)

## 2011-12-03 LAB — LIPID PANEL
HDL: 34 mg/dL — ABNORMAL LOW (ref >39)
LDL Cholesterol: 106 mg/dL — ABNORMAL HIGH (ref 0–99)
VLDL: 30 mg/dL (ref 0–40)

## 2011-12-03 LAB — BASIC METABOLIC PANEL
Calcium: 9 mg/dL (ref 8.4–10.5)
GFR calc non Af Amer: >90 mL/min (ref >90)
Sodium: 138 mEq/L (ref 135–145)

## 2011-12-03 LAB — HEMOGLOBIN A1C: Hgb A1c MFr Bld: 6.2 % — ABNORMAL HIGH (ref <5.7)

## 2011-12-03 MED ORDER — CARVEDILOL 6.25 MG PO TABS
6.2500 mg | ORAL_TABLET | Freq: Two times a day (BID) | ORAL | Status: DC
  Administered 2011-12-03 – 2011-12-05 (×4): 6.25 mg via ORAL
  Filled 2011-12-03 (×6): qty 1

## 2011-12-03 MED ORDER — LISINOPRIL 10 MG PO TABS
10.0000 mg | ORAL_TABLET | Freq: Every day | ORAL | Status: DC
  Administered 2011-12-04 – 2011-12-05 (×2): 10 mg via ORAL
  Filled 2011-12-03 (×2): qty 1

## 2011-12-03 NOTE — Progress Notes (Addendum)
Subjective:  Reviewed Dr. Sharee Pimple note.  Doing well no complaints, no CP, no SOB  Objective:  Vital Signs in the last 24 hours: Temp:  [97.9 F (36.6 C)-99.4 F (37.4 C)] 99.4 F (37.4 C) (03/31 0400) Pulse Rate:  [75-92] 82  (03/31 0700) Resp:  [10-25] 21  (03/31 0800) BP: (88-174)/(39-112) 148/95 mmHg (03/31 0800) SpO2:  [93 %-98 %] 95 % (03/31 0800) Weight:  [117.2 kg (258 lb 6.1 oz)] 117.2 kg (258 lb 6.1 oz) (03/31 0600)  Intake/Output from previous day: 03/30 0701 - 03/31 0700 In: 929 [P.O.:860; I.V.:69] Out: 4275 [Urine:4275]   Physical Exam: General: Well developed, well nourished, in no acute distress. Head:  Normocephalic and atraumatic. Lungs: Clear to auscultation and percussion. Heart: Normal S1 and S2.  No murmur, rubs or gallops.  Pulses: Pulses normal in all 4 extremities. Abdomen: soft, non-tender, positive bowel sounds. Extremities: No clubbing or cyanosis. No edema. Cath site c/d/i Neurologic: Alert and oriented x 3.    Lab Results:  Basename 12/03/11 0046 12/02/11 0806  WBC 13.9* 15.8*  HGB 14.6 14.4  PLT 178 196    Basename 12/03/11 0046 12/02/11 0806  NA 138 139  K 3.8 4.4  CL 101 101  CO2 27 30  GLUCOSE 102* 122*  BUN 13 19  CREATININE 0.77 0.79    Basename 12/02/11 0806 12/02/11 0120  TROPONINI 7.30* 2.82*   Hepatic Function Panel  Basename 12/02/11 0806  PROT 6.1  ALBUMIN 3.5  AST 47*  ALT 23  ALKPHOS 49  BILITOT 0.7  BILIDIR --  IBILI --    Basename 12/03/11 0046  CHOL 170   Telemetry: no VT, NST Personally viewed.   EKG:  NSR, subtle ST elevation 0.9mm inf leads this am  Cardiac Studies:  Cath - see report, EF 55%  Assessment/Plan:  STEMI  - inferior, as above, PTCA only to RCA non dom. Significant LAD and Circ. disease  - Trop 7.3 MB 52 peak - ECHO - normal EF  - Because of severe multivessel disease, Dr. Laneta Simmers was consulted, note reviewed.- Did get Plavix load during cath 3/29 at 7PM, no further dose.  Because of this, Dr. Laneta Simmers wants to wait 5 days until CABG. He will be out of town next week but could have a partner perform CABG.  - Optimal to wait here in the hospital for surgery given STEMI, severity of disease.  - Increase Coreg to 6.25 bid, atorvastatin 80, ASA 81, NTG IV wean off, Enoxaparin  - LDL 106   Tobacco  - cessation discussed  - cigars   DM  - monitor, just diagnosed.  - A1c 6.2 - ISS   Leukocytosis  - secondary to STEMI, trending down   HTN  - was on lisinopril as outpt.  - started 5mg . BP elevated, will increase to 10mg .   Will transfer to tele.  Daniel [name] 12/03/2011, 10:38 AM

## 2011-12-04 ENCOUNTER — Encounter: Payer: Self-pay | Admitting: Cardiology

## 2011-12-04 ENCOUNTER — Inpatient Hospital Stay (HOSPITAL_COMMUNITY): Payer: Medicare Other

## 2011-12-04 DIAGNOSIS — I2119 ST elevation (STEMI) myocardial infarction involving other coronary artery of inferior wall: Secondary | ICD-10-CM

## 2011-12-04 DIAGNOSIS — Z01810 Encounter for preprocedural cardiovascular examination: Secondary | ICD-10-CM

## 2011-12-04 LAB — BASIC METABOLIC PANEL
Chloride: 106 mEq/L (ref 96–112)
Creatinine, Ser: 0.78 mg/dL (ref 0.50–1.35)
GFR calc Af Amer: >90 mL/min (ref >90)
GFR calc non Af Amer: >90 mL/min (ref >90)
Potassium: 3.9 mEq/L (ref 3.5–5.1)

## 2011-12-04 LAB — GLUCOSE, CAPILLARY
Glucose-Capillary: 105 mg/dL — ABNORMAL HIGH (ref 70–99)
Glucose-Capillary: 136 mg/dL — ABNORMAL HIGH (ref 70–99)

## 2011-12-04 LAB — PULMONARY FUNCTION TEST

## 2011-12-04 MED FILL — Dextrose Inj 5%: INTRAVENOUS | Qty: 50 | Status: AC

## 2011-12-04 NOTE — Progress Notes (Signed)
Pt walking independently without sx. Instructed pt on IS, videos, walking daily. Voiced understanding. Will sign off until after CABG. [id number] Ethelda Chick CES, ACSM

## 2011-12-04 NOTE — Progress Notes (Signed)
UR Completed. Simmons, Donya Hitch F [phone]  

## 2011-12-04 NOTE — Progress Notes (Signed)
VASCULAR LAB PRELIMINARY  PRELIMINARY  PRELIMINARY  PRELIMINARY  Pre-op Cardiac Surgery  Carotid Findings:  Bilateral:  No evidence of hemodynamically significant internal carotid artery stenosis.   Vertebral artery flow is antegrade.     Upper Extremity Right Left  Brachial Pressures 120 tri 116 tri  Radial Waveforms tri tri  Ulnar Waveforms tri tri  Palmar Arch (Allen's Test) Obliterates with radial compression, normal with ulnar compression Normal with radial compression, obliterates with ulnar compression   Findings:   Right: Obliterates with radial compression, normal with ulnar compression  Left:  Normal with radial compression, obliterates with ulnar compression   Lower  Extremity Right Left  Dorsalis Pedis 142 tri 142 tri  Anterior Tibial    Posterior Tibial 140 tri 145 tri  Ankle/Brachial Indices 1.18 1.21    Findings:  Within normal limits    Daniel [name], RVT 12/04/2011, 5:56 PM

## 2011-12-04 NOTE — Progress Notes (Addendum)
Cardiology Progress Note Patient Name: Daniel [name] Date of Encounter: 12/04/2011, 11:35 AM     Subjective  No overnight events. Patient doing well and able to ambulate without complaints of chest pain or sob. Would like to go home if CABG isn't until next week.   Objective   Telemetry: Sinus rhythm 60s-80s  Medications: . aspirin EC  81 mg Oral Daily  . atorvastatin  80 mg Oral q1800  . carvedilol  6.25 mg Oral BID WC  . enoxaparin  40 mg Subcutaneous Q24H  . insulin aspart  0-15 Units Subcutaneous TID WC  . lisinopril  10 mg Oral Daily  . sodium chloride  3 mL Intravenous Q12H    Physical Exam: Temp:  [97.3 F (36.3 C)-98.6 F (37 C)] 98.6 F (37 C) (04/01 0600) Pulse Rate:  [75-90] 75  (04/01 0600) Resp:  [18] 18  (04/01 0600) BP: (117-136)/(69-78) 136/69 mmHg (04/01 0600) SpO2:  [97 %] 97 % (04/01 0600)  General: Overweight white male, in no acute distress. Head: Normocephalic, atraumatic, sclera non-icteric, nares are without discharge.  Neck: Supple. Negative for carotid bruits or JVD Lungs: Clear bilaterally to auscultation without wheezes, rales, or rhonchi. Breathing is unlabored. Heart: RRR S1 S2 without murmurs, rubs, or gallops.  Abdomen: Soft, non-tender, non-distended with normoactive bowel sounds. No rebound/guarding. No obvious abdominal masses. Msk:  Strength and tone appear normal for age. Extremities: Trace BLE edema. No clubbing or cyanosis. Distal pedal pulses are intact and equal bilaterally. Neuro: Alert and oriented X 3. Moves all extremities spontaneously. Psych:  Responds to questions appropriately with a normal affect.   Labs:  [facility] 12/03/11 2355 12/03/11 0046  NA 140 138  K 3.9 3.8  CL 106 101  CO2 25 27  GLUCOSE 115* 102*  BUN 23 13  CREATININE 0.78 0.77  CALCIUM 9.2 9.0   Basename 12/02/11 0806  AST 47*  ALT 23  ALKPHOS 49  BILITOT 0.7  PROT 6.1  ALBUMIN 3.5   Basename 12/03/11 0046 12/02/11 0806  WBC 13.9*  15.8*  HGB 14.6 14.4  HCT 41.2 41.8  MCV 91.4 92.1  PLT 178 196   Basename 12/02/11 0806 12/02/11 0120 12/01/11 2015  CKTOTAL 567* 296* 164  CKMB 52.8* 24.8* 8.4*  TROPONINI 7.30* 2.82* 1.02*   Basename 12/03/11 0046  HGBA1C 6.2*   Basename 12/03/11 0046  CHOL 170  HDL 34*  LDLCALC 106*  TRIG 151*  CHOLHDL 5.0     12/03/2011 23:55  Platelet Function  P2Y12 233   Radiology/Studies:   12/01/11 - Cardiac Cath Hemodynamics:  AO 172/111(140)  LV not done  Coronary angiography:  Coronary dominance: left  Left mainstem: Heavily calcified. Not critically narrowed.  Left anterior descending (LAD): Courses to the apex. Also calcified. There is 70% narrowing in the LAD at the diagonal, and the diagonal has about 90% narrowing. There is an 80-90% mid lesion with larger distal vessel, and the apical vessel appears diffusely narrow. Both the diagonal and LAD appear graftable.  Left circumflex (LCx): There is a large ramus vessel that has ostial narrowing of 80% at its takeoff from the trifurcation. The OM 1 has 80% proximal narrowing, and is a large vessel distally. There also appears to be a 70% or more stenosis leading into the PDA (co dominant)  Right coronary artery (RCA): Non dominant vessel with a small codom PDA distally. Shephard's crook takeoff proximally followed by a 95 % lesion at the prox mid  junction, then a subtotal distal lesion with TIMI 1 flow. At the completion of the procedure there was TIMI 3 flow with spasm throughout the small caliber vessel. The lesions themselves were open with the proximal 50% with significant intimal disruption, and the distal with diffuse spasm distally, and at least 50-70% narrowing. The potential for abrupt closure (small vessel) was thought high, but the vessel was not amenable to either stenting or bypass. The amount of myocardium supplied was small.  Left ventriculography: Left ventricular systolic function is normal, LVEF is estimated at 55-65%,  there is no significant mitral regurgitation  Final Conclusions:  1. Inferior MI with anterior changes due to non dominant RCA with tandem lesions.  2. PTCA only with inability to pass stents, and suboptimal final result but small distal vessel.  3. Severe disease of the LAD and LCX that will require elective revascularizationi.  Recommendations:  1. Dr. Laneta Simmers has been called and will see tomorrow  2. We will hold clopidogrel since no stent was placed, and surgery anticipated.  3. Add medical therapy.     Assessment and Plan  66 y.o. male w/ PMHx significant for obesity, tobacco abuse, DMII & HTN, no prior cardiac history who presented to [facility] on 12/01/11 with complaints of chest pain and found to have acute inferior STEMI.  1. Inferior STEMI: Cardiac cath revealed non dominant RCA disease, severe LAD and LCx disease, EF 55-65%. PTCA only to RCA with inability to pass stents. TCTS evaluated and plans for CABG after Plavix washout (last dose 3/29). CABG scheduled with Dr. Laneta Simmers next Wednesday 12/13/11. TCTS to arrange follow up with patient. Cont ASA, BB, statin, ACEI, PRN NTG.  2. Leukocytosis: In the setting of acute MI. Trending down. Afebrile, lungs CTA.  3. HTN: Lisinopril and BB increased yesterday. SBPs 110s-130s over last 24hrs. Cont current med regimen  4. HLD: LDL 106, initiated on statin. Will need lipids/LFTs 6-8wks  5. DMII: Diagnosed 2012, A1C 6.2, Metformin on hold. Cont SSI  6. Tobacco abuse: Needs to stop smoking! Awaiting tobacco cessation counseling.   Signed, Tyrique Sporn PA-C  Patient is stable and doing well.  LV function is normal.  Will likely dc tomorrow and plan surgery some time next week as Dr. Laneta Simmers is gone, and unclear about timing.  He is getting along well post MI.    Daniel [name] 2:26 PM 12/04/2011

## 2011-12-05 DIAGNOSIS — E785 Hyperlipidemia, unspecified: Secondary | ICD-10-CM

## 2011-12-05 DIAGNOSIS — E669 Obesity, unspecified: Secondary | ICD-10-CM

## 2011-12-05 DIAGNOSIS — Z720 Tobacco use: Secondary | ICD-10-CM

## 2011-12-05 DIAGNOSIS — I213 ST elevation (STEMI) myocardial infarction of unspecified site: Secondary | ICD-10-CM

## 2011-12-05 HISTORY — DX: ST elevation (STEMI) myocardial infarction of unspecified site: I21.3

## 2011-12-05 HISTORY — DX: Tobacco use: Z72.0

## 2011-12-05 MED ORDER — NITROGLYCERIN 0.4 MG SL SUBL: 0.4000 mg | SUBLINGUAL_TABLET | SUBLINGUAL | Status: DC | PRN

## 2011-12-05 MED ORDER — LISINOPRIL 10 MG PO TABS: 10.0000 mg | ORAL_TABLET | Freq: Every day | ORAL | Status: DC

## 2011-12-05 MED ORDER — ATORVASTATIN CALCIUM 80 MG PO TABS: 80.0000 mg | ORAL_TABLET | Freq: Every day | ORAL | Status: DC

## 2011-12-05 MED ORDER — CARVEDILOL 6.25 MG PO TABS: 6.2500 mg | ORAL_TABLET | Freq: Two times a day (BID) | ORAL | Status: DC

## 2011-12-05 MED ORDER — ASPIRIN 81 MG PO TBEC: 81.0000 mg | DELAYED_RELEASE_TABLET | Freq: Every day | ORAL | Status: DC

## 2011-12-05 NOTE — Discharge Summary (Signed)
Discharge Summary   Patient ID: Daniel [name] MRN: [id number], DOB/AGE: 1946/03/07 66 y.o.  Primary MD: Lucina Mellow, MD - Urgent Care Primary Cardiologist: [name] MD  Admit date: 12/01/2011 D/C date:     12/05/2011      Primary Discharge Diagnoses:  1. Acute Inferior STEMI/CAD  - Cardiac cath 3/19 revealed non dominant RCA disease, severe LAD and LCx disease, EF 55-65%. PTCA only to RCA with inability to pass stents.  - Plans for CABG w/ Dr. Laneta Simmers 12/13/11  2. Hypertension  - Uncontrolled on admission, Meds titrated  3. Hyperlipidemia  - LDL 106  - Statin initiated this admission, will need lipids/Lfts 6-8wks  4. Tobacco abuse  Secondary Discharge Diagnoses:  1. Diabetes Mellitus, Type 2 - A1C 6.2 2. Obesity  Allergies Allergies  Allergen Reactions  . Penicillins     Diagnostic Studies/Procedures:   12/01/11 - Cardiac Cath Hemodynamics:  AO 172/111(140)  LV not done  Coronary angiography:  Coronary dominance: left  Left mainstem: Heavily calcified. Not critically narrowed.  Left anterior descending (LAD): Courses to the apex. Also calcified. There is 70% narrowing in the LAD at the diagonal, and the diagonal has about 90% narrowing. There is an 80-90% mid lesion with larger distal vessel, and the apical vessel appears diffusely narrow. Both the diagonal and LAD appear graftable.  Left circumflex (LCx): There is a large ramus vessel that has ostial narrowing of 80% at its takeoff from the trifurcation. The OM 1 has 80% proximal narrowing, and is a large vessel distally. There also appears to be a 70% or more stenosis leading into the PDA (co dominant)  Right coronary artery (RCA): Non dominant vessel with a small codom PDA distally. Shephard's crook takeoff proximally followed by a 95 % lesion at the prox mid junction, then a subtotal distal lesion with TIMI 1 flow. At the completion of the procedure there was TIMI 3 flow with spasm throughout the small  caliber vessel. The lesions themselves were open with the proximal 50% with significant intimal disruption, and the distal with diffuse spasm distally, and at least 50-70% narrowing. The potential for abrupt closure (small vessel) was thought high, but the vessel was not amenable to either stenting or bypass. The amount of myocardium supplied was small.  Left ventriculography: Left ventricular systolic function is normal, LVEF is estimated at 55-65%, there is no significant mitral regurgitation  Final Conclusions:  1. Inferior MI with anterior changes due to non dominant RCA with tandem lesions.  2. PTCA only with inability to pass stents, and suboptimal final result but small distal vessel.  3. Severe disease of the LAD and LCX that will require elective revascularizationi.  Recommendations:  1. Dr. Laneta Simmers has been called and will see tomorrow  2. We will hold clopidogrel since no stent was placed, and surgery anticipated.  3. Add medical therapy.   12/01/11 - 2D Echocardiogram Study Conclusions:  Left ventricle: The cavity size was normal. There was mild concentric hypertrophy. Systolic function was vigorous. The estimated ejection fraction was in the range of 65% to 70%. Wall motion was normal; there were no regional wall motion abnormalities. Doppler parameters are consistent with abnormal left ventricular relaxation (grade 1 diastolic dysfunction).    12/04/11 - Carotid Dopplers  Findings: Bilateral: No evidence of hemodynamically significant internal carotid artery stenosis. Vertebral artery flow is antegrade.  History of Present Illness: 65yom w/ PMHx significant for obesity, tobacco abuse, DMII & HTN, no prior cardiac history who presented to  [facility] on 12/01/11 with complaints of chest pain and found to have acute inferior STEMI.  He was in his USOH until the day of presentation he developed severe sscp and dyspnea while sitting and watching TV. He took 2 ASA 325mg  tabs and drove  himself to the [facility] ED. There, he was found to have significant ST elevation in inferior leads along with V3-4. Code STEMI was called and he was taken emergently to the [facility] cath lab.  Hospital Course:  He underwent emergent cardiac catheterization revealing non dominant RCA disease, severe LAD and LCx disease, and EF 55-65%. PTCA only was performed to the RCA with inability to pass stents. He tolerated the procedure well without complications. He was initiated on ASA, BB, and statin. Echocardiogram revealed mild LVH, nl LV systolic function, EF 65-70%, no RWMA, and grade 1 diastolic dysfunction.   He was evaluated by Dr. Laneta Simmers (TCTS) due to the severe multivessel disease who felt he would benefit from revascularization with CABG. He received Plavix prior to cath and therefore required a Plavix washout period. As he was stable and the surgery was not planned for another week, he was felt stable for discharge home with plans to return next week for CABG.  He was able to ambulate without complaints of sob or chest pain. He was seen and evaluated by Dr. Riley Kill who felt he was stable for discharge home with plans for follow up as scheduled below. He was instructed not to drive until cleared by Dr. Riley Kill and to stop smoking.  Discharge Vitals: Blood pressure 113/78, pulse 66, temperature 96.5 F (35.8 C), temperature source Axillary, resp. rate 18, height 5\' 6"  (1.676 m), weight 260 lb (117.935 kg), SpO2 98.00%.  Labs: Component Value Date   WBC 13.9* 12/03/2011   HGB 14.6 12/03/2011   HCT 41.2 12/03/2011   MCV 91.4 12/03/2011   PLT 178 12/03/2011    Lab 12/03/11 2355 12/02/11 0806  NA 140 --  K 3.9 --  CL 106 --  CO2 25 --  BUN 23 --  CREATININE 0.78 --  CALCIUM 9.2 --  PROT -- 6.1  BILITOT -- 0.7  ALKPHOS -- 49  ALT -- 23  AST -- 47*  GLUCOSE 115* --   Basename  12/02/11 0806  12/02/11 0120  12/01/11 2015   CKTOTAL  567*  296*  164   CKMB  52.8*  24.8*  8.4*   TROPONINI  7.30*  2.82*   1.02*    Basename  12/03/11 0046   HGBA1C  6.2*    Basename  12/03/11 0046   CHOL  170   HDL  34*   LDLCALC  106*   TRIG  151*   CHOLHDL  5.0     12/03/2011 23:55   Platelet Function P2Y12  233    Discharge Medications   Medication List  As of 12/05/2011 10:53 AM   STOP taking these medications         lisinopril-hydrochlorothiazide 20-12.5 MG per tablet         TAKE these medications         aspirin 81 MG EC tablet   Take 1 tablet (81 mg total) by mouth at bedtime.      atorvastatin 80 MG tablet   Commonly known as: LIPITOR   Take 1 tablet (80 mg total) by mouth at bedtime.      carvedilol 6.25 MG tablet   Commonly known as: COREG   Take 1 tablet (  6.25 mg total) by mouth 2 (two) times daily with a meal.      lisinopril 10 MG tablet   Commonly known as: PRINIVIL,ZESTRIL   Take 1 tablet (10 mg total) by mouth daily.      metFORMIN 850 MG tablet   Commonly known as: GLUCOPHAGE   Take 850 mg by mouth 2 (two) times daily with a meal.      mulitivitamin with minerals Tabs   Take 1 tablet by mouth daily.      nitroGLYCERIN 0.4 MG SL tablet   Commonly known as: NITROSTAT   Place 1 tablet (0.4 mg total) under the tongue every 5 (five) minutes x 3 doses as needed for chest pain (up to 3 doses).            Disposition   Discharge Orders    Future Appointments: Provider: Department: Dept Phone: Center:   12/07/2011 2:00 PM Herby Abraham, MD [facility] [phone] [facility]     Future Orders Please Complete By Expires   Diet - low sodium heart healthy      Increase activity slowly      Discharge instructions      Comments:   **PLEASE REMEMBER TO BRING ALL OF YOUR MEDICATIONS TO EACH OF YOUR FOLLOW-UP OFFICE VISITS.  * NO DRIVING UNTIL CLEARED BY MD  * STOP SMOKING!  * KEEP GROIN SITE CLEAN AND DRY. Call the office for any signs of bleedings, pus, swelling, increased pain, or any other concerns. * NO HEAVY LIFTING (>10lbs) X 4 WEEKS. * NO  SEXUAL ACTIVITY X 4 WEEKS. * NO SOAKING BATHS, HOT TUBS, POOLS, ETC., X 4 DAYS.      Follow-up Information    Follow up with [name], MD on 12/07/2011. (1:45)    Contact information:   [facility] [address] [place] [address] [phone]       Follow up with Alleen Borne, MD. (Will call you with follow up information. )    Contact information:   Cardiothoracic Surgery [address] [facility] [address] [place] [address] [phone]           Outstanding Labs/Studies:  1. Statin initiated this admission, will need lipids/Lfts 6-8wks  Duration of Discharge Encounter: Greater than 30 minutes including physician and PA time.  Signed, Sinai Mahany PA-C 12/05/2011, 10:53 AM

## 2011-12-05 NOTE — Progress Notes (Signed)
Subjective:  He is doing well.  Surgery is planned for next Monday or Wednesday, so he would like to go home.  No chest pain.  Ambulating without difficulty.  No alarms on telemetry.    Objective:  Vital Signs in the last 24 hours: Temp:  [97.7 F (36.5 C)-98.6 F (37 C)] 98.1 F (36.7 C) (04/02 0500) Pulse Rate:  [67-73] 70  (04/02 0803) Resp:  [18] 18  (04/02 0500) BP: (95-143)/(63-86) 116/78 mmHg (04/02 0500) SpO2:  [93 %-95 %] 95 % (04/02 0500) Weight:  [153 lb 3.5 oz (69.5 kg)-260 lb (117.935 kg)] 260 lb (117.935 kg) (04/02 0500)  Intake/Output from previous day:     Physical Exam: General: Well developed, well nourished, in no acute distress. Head:  Normocephalic and atraumatic. Lungs: Clear to auscultation and percussion. Heart: Normal S1 and S2.  No murmur, rubs or gallops   Neurologic: Alert and oriented x 3.    Lab Results:  [facility] 12/03/11 0046  WBC 13.9*  HGB 14.6  PLT 178    Basename 12/03/11 2355 12/03/11 0046  NA 140 138  K 3.9 3.8  CL 106 101  CO2 25 27  GLUCOSE 115* 102*  BUN 23 13  CREATININE 0.78 0.77   No results found for this basename: TROPONINI:2,CK,MB:2 in the last 72 hours Hepatic Function Panel No results found for this basename: PROT,ALBUMIN,AST,ALT,ALKPHOS,BILITOT,BILIDIR,IBILI in the last 72 hours  Basename 12/03/11 0046  CHOL 170   No results found for this basename: PROTIME in the last 72 hours  Imaging: No results found.  EKG:  See tracing of 3/31  Cardiac Studies:  ECHO  Study Conclusions  Left ventricle: The cavity size was normal. There was mild concentric hypertrophy. Systolic function was vigorous. The estimated ejection fraction was in the range of 65% to 70%. Wall motion was normal; there were no regional wall motion abnormalities. Doppler parameters are consistent with abnormal left ventricular relaxation (grade 1 diastolic    Assessment/Plan:  Patient Active Hospital Problem List: NSTEMI (non-ST  elevated myocardial infarction) (12/01/2011)   Assessment: stable without chest pain.  Severe 3 v disease.  Small non dom RCA infarct   Plan: CABG next week HTN (hypertension) ()   Assessment: currently well controlled   Plan: home on current meds Diabetes mellitus type 2, noninsulin dependent ()   Assessment: sugars fairly good   Plan: resume metformin at home CAD (coronary artery disease) (12/01/2011)   Assessment: see consult note   Plan: CABG next week.  TCTS will call him  (spoke with Forestine Na today)  Patient told not to drive or smoke, resume Metformin, and see me in office on Thursday at 1:45 pm Instructions reviewed regarding activity.        [name], MD, [facility], FSCAI 12/05/2011, 9:43 AM

## 2011-12-05 NOTE — Consult Note (Signed)
Reviewed By Marinda Elk EdD

## 2011-12-06 ENCOUNTER — Encounter (HOSPITAL_COMMUNITY): Payer: Self-pay | Admitting: Pharmacy Technician

## 2011-12-06 ENCOUNTER — Other Ambulatory Visit: Payer: Self-pay

## 2011-12-06 DIAGNOSIS — I2510 Atherosclerotic heart disease of native coronary artery without angina pectoris: Secondary | ICD-10-CM

## 2011-12-07 ENCOUNTER — Ambulatory Visit (INDEPENDENT_AMBULATORY_CARE_PROVIDER_SITE_OTHER): Payer: Medicare Other | Admitting: Cardiology

## 2011-12-07 ENCOUNTER — Encounter: Payer: Self-pay | Admitting: Cardiology

## 2011-12-07 VITALS — BP 122/88 | HR 67 | Ht 71.0 in | Wt 261.8 lb

## 2011-12-07 DIAGNOSIS — I10 Essential (primary) hypertension: Secondary | ICD-10-CM

## 2011-12-07 DIAGNOSIS — I6529 Occlusion and stenosis of unspecified carotid artery: Secondary | ICD-10-CM

## 2011-12-07 DIAGNOSIS — I2510 Atherosclerotic heart disease of native coronary artery without angina pectoris: Secondary | ICD-10-CM

## 2011-12-07 DIAGNOSIS — F17200 Nicotine dependence, unspecified, uncomplicated: Secondary | ICD-10-CM

## 2011-12-07 DIAGNOSIS — E119 Type 2 diabetes mellitus without complications: Secondary | ICD-10-CM

## 2011-12-07 LAB — BASIC METABOLIC PANEL
BUN: 27 mg/dL — ABNORMAL HIGH (ref 6–23)
CO2: 28 mEq/L (ref 19–32)
Calcium: 9.3 mg/dL (ref 8.4–10.5)
Creatinine, Ser: 0.8 mg/dL (ref 0.4–1.5)
GFR: 102.84 mL/min (ref >60.00)
Glucose, Bld: 65 mg/dL — ABNORMAL LOW (ref 70–99)

## 2011-12-07 NOTE — Patient Instructions (Signed)
Your physician recommends that you have lab work today: [facility]  Your physician recommends that you schedule a follow-up appointment in: 3 WEEKS with Dr Riley Kill  Your physician recommends that you continue on your current medications as directed. Please refer to the Current Medication list given to you today.

## 2011-12-09 NOTE — Progress Notes (Signed)
HPI:  He is in for follow up.  In general, he has been stable.  We reviewed his anatomy in detail today.  He presented with a nondominant RCA infarct that was difficult from the standpoint that it was small, tortuous, and we dilated, but could not pass a stent down the vessel effectively.  He had a small enzyme bump, but severe three vessel CAD.  He currently is not on plavix, and he is awaiting elective CABG.  No further pain.  He has been seen in consult by Dr. Laneta Simmers who is in agreement that he would be best served by bypass.  Since discharge, he has done well.  No recurrent chest pain.  We discussed his case with the patient and his family today, and he was in agreement to go ahead on Wednesday of next week.    Current Outpatient Prescriptions  Medication Sig Dispense Refill  . aspirin EC 81 MG EC tablet Take 1 tablet (81 mg total) by mouth at bedtime.      Marland Kitchen atorvastatin (LIPITOR) 80 MG tablet Take 1 tablet (80 mg total) by mouth at bedtime.  30 tablet  2  . carvedilol (COREG) 6.25 MG tablet Take 1 tablet (6.25 mg total) by mouth 2 (two) times daily with a meal.  60 tablet  6  . lisinopril (PRINIVIL,ZESTRIL) 10 MG tablet Take 1 tablet (10 mg total) by mouth daily.  30 tablet  6  . metFORMIN (GLUCOPHAGE) 850 MG tablet Take 850 mg by mouth 2 (two) times daily with a meal.      . Multiple Vitamin (MULITIVITAMIN WITH MINERALS) TABS Take 1 tablet by mouth daily.      . nitroGLYCERIN (NITROSTAT) 0.4 MG SL tablet Place 1 tablet (0.4 mg total) under the tongue every 5 (five) minutes x 3 doses as needed for chest pain (up to 3 doses).  25 tablet  3    Allergies  Allergen Reactions  . Penicillins   . Penicillins Hives and Rash    Past Medical History  Diagnosis Date  . HTN (hypertension)   . Diabetes mellitus type 2, noninsulin dependent     a. diagnosed 2012  . Coronary artery disease   . Myocardial infarction   . Diabetes mellitus   . GERD (gastroesophageal reflux disease)     Past  Surgical History  Procedure Date  . Cardiac catheterization     Family History  Problem Relation Age of Onset  . Heart attack Father     died @ 21's  . Heart attack Mother     died @ 15's  . Heart attack Brother     died @ 69  . Heart attack Brother     died @ 52    History   Social History  . Marital Status: Widowed    Spouse Name: N/A    Number of Children: N/A  . Years of Education: N/A   Occupational History  . Not on file.   Social History Main Topics  . Smoking status: Former Smoker -- 2 years    Types: Cigars  . Smokeless tobacco: Not on file   Comment: smokes 1 cigar/day for many years  . Alcohol Use: No  . Drug Use: No  . Sexually Active: Not Currently   Other Topics Concern  . Not on file   Social History Narrative   Pt lives by himself in [place].  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  He does not routinely exercise or adhere to  any particular diet.    ROS: Please see the HPI.  All other systems reviewed and negative.  PHYSICAL EXAM:  BP 122/88  Pulse 67  Ht 5\' 11"  (1.803 m)  Wt 261 lb 12.8 oz (118.752 kg)  BMI 36.51 kg/m2  General: Well developed, well nourished, in no acute distress. Head:  Normocephalic and atraumatic. Neck: no JVD Lungs: Clear to auscultation and percussion. Heart: Normal S1 and S2.  No murmur, rubs or gallops.  Abdomen:  Normal bowel sounds; soft; non tender; no organomegaly Pulses: Pulses normal in all 4 extremities. Extremities: No clubbing or cyanosis. No edema. Neurologic: Alert and oriented x 3.  EKG:  NSR.  Inferior MI, age indeterminate.  Delay in R wave progression likely secondary to lead position.  CATH    Coronary angiography:  Coronary dominance: left  Left mainstem: Heavily calcified. Not critically narrowed.  Left anterior descending (LAD): Courses to the apex. Also calcified. There is 70% narrowing in the LAD at the diagonal, and the diagonal has about 90% narrowing. There is an 80-90% mid lesion with larger  distal vessel, and the apical vessel appears diffusely narrow. Both the diagonal and LAD appear graftable.  Left circumflex (LCx): There is a large ramus vessel that has ostial narrowing of 80% at its takeoff from the trifurcation. The OM 1 has 80% proximal narrowing, and is a large vessel distally. There also appears to be a 70% or more stenosis leading into the PDA (co dominant)  Right coronary artery (RCA): Non dominant vessel with a small codom PDA distally. Shephard's crook takeoff proximally followed by a 95 % lesion at the prox mid junction, then a subtotal distal lesion with TIMI 1 flow. At the completion of the procedure there was TIMI 3 flow with spasm throughout the small caliber vessel. The lesions themselves were open with the proximal 50% with significant intimal disruption, and the distal with diffuse spasm distally, and at least 50-70% narrowing. The potential for abrupt closure (small vessel) was thought high, but the vessel was not amenable to either stenting or bypass. The amount of myocardium supplied was small.  Left ventriculography: Left ventricular systolic function is normal, LVEF is estimated at 55-65%, there is no significant mitral regurgitation      ASSESSMENT AND PLAN:

## 2011-12-11 ENCOUNTER — Ambulatory Visit (HOSPITAL_COMMUNITY): Admission: RE | Admit: 2011-12-11 | Payer: Medicare Other | Source: Ambulatory Visit

## 2011-12-11 ENCOUNTER — Encounter (HOSPITAL_COMMUNITY): Payer: Self-pay

## 2011-12-11 ENCOUNTER — Observation Stay (HOSPITAL_COMMUNITY)
Admission: EM | Admit: 2011-12-11 | Discharge: 2011-12-12 | Disposition: A | Discharge status: Home / Self Care | Payer: Medicare Other | Attending: Emergency Medicine | Admitting: Emergency Medicine

## 2011-12-11 ENCOUNTER — Encounter (HOSPITAL_COMMUNITY): Payer: Self-pay | Admitting: Emergency Medicine

## 2011-12-11 ENCOUNTER — Encounter (HOSPITAL_COMMUNITY)
Admission: RE | Admit: 2011-12-11 | Discharge: 2011-12-11 | Disposition: A | Discharge status: Home / Self Care | Payer: Medicare Other | Source: Ambulatory Visit | Attending: Surgery | Admitting: Surgery

## 2011-12-11 VITALS — BP 141/77 | HR 71 | Temp 97.0°F | Resp 20 | Ht 71.0 in | Wt 257.7 lb

## 2011-12-11 DIAGNOSIS — Z79899 Other long term (current) drug therapy: Secondary | ICD-10-CM | POA: Insufficient documentation

## 2011-12-11 DIAGNOSIS — Y999 Unspecified external cause status: Secondary | ICD-10-CM | POA: Insufficient documentation

## 2011-12-11 DIAGNOSIS — Z7902 Long term (current) use of antithrombotics/antiplatelets: Secondary | ICD-10-CM | POA: Insufficient documentation

## 2011-12-11 DIAGNOSIS — Y92009 Unspecified place in unspecified non-institutional (private) residence as the place of occurrence of the external cause: Secondary | ICD-10-CM | POA: Insufficient documentation

## 2011-12-11 DIAGNOSIS — I252 Old myocardial infarction: Secondary | ICD-10-CM | POA: Insufficient documentation

## 2011-12-11 DIAGNOSIS — I2510 Atherosclerotic heart disease of native coronary artery without angina pectoris: Secondary | ICD-10-CM

## 2011-12-11 DIAGNOSIS — E119 Type 2 diabetes mellitus without complications: Secondary | ICD-10-CM | POA: Insufficient documentation

## 2011-12-11 DIAGNOSIS — T783XXA Angioneurotic edema, initial encounter: Principal | ICD-10-CM | POA: Insufficient documentation

## 2011-12-11 DIAGNOSIS — X58XXXA Exposure to other specified factors, initial encounter: Secondary | ICD-10-CM | POA: Insufficient documentation

## 2011-12-11 DIAGNOSIS — I10 Essential (primary) hypertension: Secondary | ICD-10-CM | POA: Insufficient documentation

## 2011-12-11 DIAGNOSIS — K219 Gastro-esophageal reflux disease without esophagitis: Secondary | ICD-10-CM | POA: Insufficient documentation

## 2011-12-11 LAB — CBC
HCT: 40.5 % (ref 39.0–52.0)
Hemoglobin: 14 g/dL (ref 13.0–17.0)
MCHC: 34.6 g/dL (ref 30.0–36.0)
MCV: 90.4 fL (ref 78.0–100.0)

## 2011-12-11 LAB — BLOOD GAS, ARTERIAL
Acid-base deficit: 0.1 mmol/L (ref 0.0–2.0)
Bicarbonate: 23.2 mEq/L (ref 20.0–24.0)
O2 Saturation: 97.9 %
Patient temperature: 98.6
TCO2: 24.2 mmol/L (ref 0–100)

## 2011-12-11 LAB — URINALYSIS, ROUTINE W REFLEX MICROSCOPIC
Glucose, UA: NEGATIVE mg/dL
Hgb urine dipstick: NEGATIVE
Leukocytes, UA: NEGATIVE
pH: 5.5 (ref 5.0–8.0)

## 2011-12-11 LAB — COMPREHENSIVE METABOLIC PANEL
Alkaline Phosphatase: 70 U/L (ref 39–117)
BUN: 15 mg/dL (ref 6–23)
Creatinine, Ser: 0.6 mg/dL (ref 0.50–1.35)
GFR calc Af Amer: >90 mL/min (ref >90)
Glucose, Bld: 122 mg/dL — ABNORMAL HIGH (ref 70–99)
Potassium: 4.2 mEq/L (ref 3.5–5.1)
Total Bilirubin: 0.6 mg/dL (ref 0.3–1.2)
Total Protein: 6.4 g/dL (ref 6.0–8.3)

## 2011-12-11 LAB — HEMOGLOBIN A1C
Hgb A1c MFr Bld: 6 % — ABNORMAL HIGH (ref <5.7)
Mean Plasma Glucose: 126 mg/dL — ABNORMAL HIGH (ref <117)

## 2011-12-11 LAB — SURGICAL PCR SCREEN: MRSA, PCR: NEGATIVE

## 2011-12-11 LAB — PROTIME-INR: INR: 1.09 (ref 0.00–1.49)

## 2011-12-11 LAB — ABO/RH: ABO/RH(D): B POS

## 2011-12-11 MED ORDER — CHLORHEXIDINE GLUCONATE 4 % EX LIQD: 30.0000 mL | CUTANEOUS | Status: DC

## 2011-12-11 NOTE — Assessment & Plan Note (Signed)
No significant obstruction

## 2011-12-11 NOTE — Progress Notes (Signed)
Talked to Vascular lab stated dopplers for pre op CABG was done on 12/04/2011. Only Carotid dopplers shown in Epic. They stated they would enter all results.

## 2011-12-11 NOTE — ED Notes (Signed)
PT. PRESENTS WITH PROGRESSING TONGUE SWELLING ONSET THIS EVENING , STATES STARTED TAKING COREG 1 WEEK AGO , RESPIRATIONS UNLABORED, SPEAKING CLEARLY. AIRWAY INTACT.

## 2011-12-11 NOTE — Pre-Procedure Instructions (Signed)
20 Daniel [name]  12/11/2011   Your procedure is scheduled on:  December 13 2011 Wed. At 0830 am  Report to [facility] at 0630 am AM.  Call this number if you have problems the morning of surgery: [phone]   Remember:   Do not eat food:After Midnight.  May have clear liquids: up to 4 Hours before arrival.0230 am  Clear liquids include soda, tea, black coffee, apple or grape juice, broth.  Take these medicines the morning of surgery with A SIP OF WATER: Coreg,      Do not wear lotions, powders. You may wear deodorant.  Do not shave 48 hours prior to surgery.  Do not bring valuables to the hospital.  Contacts, dentures or bridgework may not be worn into surgery.  Leave suitcase in the car. After surgery it may be brought to your room.  For patients admitted to the hospital, checkout time is 11:00 AM the day of discharge.   Patients discharged the day of surgery will not be allowed to drive home.    Special Instructions: Incentive Spirometry - Practice and bring it with you on the day of surgery. and CHG Shower Use Special Wash: 1/2 bottle night before surgery and 1/2 bottle morning of surgery.   Please read over the following fact sheets that you were given: Pain Booklet, Coughing and Deep Breathing, Blood Transfusion Information, Open Heart Packet, MRSA Information and Surgical Site Infection Prevention

## 2011-12-11 NOTE — Assessment & Plan Note (Signed)
Borderline on current meds.  

## 2011-12-11 NOTE — Assessment & Plan Note (Signed)
Understands need to stop cigar smoking.

## 2011-12-11 NOTE — Assessment & Plan Note (Signed)
Currently has A1C of 6.2

## 2011-12-11 NOTE — Assessment & Plan Note (Signed)
Stable symptoms, but multivessel CAD.  Has extensive disease.  Favor CABG for myocardial preservation.  He will return on Wednesday for CABG.

## 2011-12-12 MED ORDER — PHENYLEPHRINE HCL 10 MG/ML IJ SOLN
30.0000 ug/min | INTRAVENOUS | Status: DC
  Filled 2011-12-12: qty 2

## 2011-12-12 MED ORDER — VERAPAMIL HCL 2.5 MG/ML IV SOLN
INTRAVENOUS | Status: AC
  Administered 2011-12-13: 10:00:00
  Filled 2011-12-12: qty 2.5

## 2011-12-12 MED ORDER — MAGNESIUM SULFATE 50 % IJ SOLN
40.0000 meq | INTRAMUSCULAR | Status: DC
  Filled 2011-12-12: qty 10

## 2011-12-12 MED ORDER — PREDNISONE (PAK) 10 MG PO TABS: 10.0000 mg | ORAL_TABLET | Freq: Every day | ORAL | Status: DC

## 2011-12-12 MED ORDER — METHYLPREDNISOLONE SODIUM SUCC 125 MG IJ SOLR
125.0000 mg | Freq: Once | INTRAMUSCULAR | Status: AC
Start: 2011-12-12 — End: 2011-12-12
  Administered 2011-12-12: 125 mg via INTRAVENOUS
  Filled 2011-12-12: qty 2

## 2011-12-12 MED ORDER — SODIUM CHLORIDE 0.9 % IV SOLN
INTRAVENOUS | Status: DC
  Filled 2011-12-12: qty 40

## 2011-12-12 MED ORDER — TRANEXAMIC ACID (OHS) PUMP PRIME SOLUTION
2.0000 mg/kg | INTRAVENOUS | Status: AC
  Filled 2011-12-12: qty 2.34

## 2011-12-12 MED ORDER — MOXIFLOXACIN HCL IN NACL 400 MG/250ML IV SOLN
400.0000 mg | INTRAVENOUS | Status: AC
  Administered 2011-12-13: 400 mg via INTRAVENOUS
  Filled 2011-12-12: qty 250

## 2011-12-12 MED ORDER — POTASSIUM CHLORIDE 2 MEQ/ML IV SOLN
80.0000 meq | INTRAVENOUS | Status: DC
  Filled 2011-12-12: qty 40

## 2011-12-12 MED ORDER — FAMOTIDINE IN NACL 20-0.9 MG/50ML-% IV SOLN
20.0000 mg | Freq: Once | INTRAVENOUS | Status: AC
  Administered 2011-12-12: 20 mg via INTRAVENOUS
  Filled 2011-12-12: qty 50

## 2011-12-12 MED ORDER — NITROGLYCERIN IN D5W 200-5 MCG/ML-% IV SOLN
2.0000 ug/min | INTRAVENOUS | Status: AC
  Administered 2011-12-13: 16.6 ug/min via INTRAVENOUS
  Filled 2011-12-12: qty 250

## 2011-12-12 MED ORDER — DIPHENHYDRAMINE HCL 25 MG PO TABS: 25.0000 mg | ORAL_TABLET | Freq: Four times a day (QID) | ORAL | Status: DC

## 2011-12-12 MED ORDER — METOPROLOL TARTRATE 12.5 MG HALF TABLET: 12.5000 mg | ORAL_TABLET | Freq: Once | ORAL | Status: DC

## 2011-12-12 MED ORDER — SODIUM CHLORIDE 0.9 % IV SOLN
0.1000 ug/kg/h | INTRAVENOUS | Status: AC
  Administered 2011-12-13: .2 ug/kg/h via INTRAVENOUS
  Filled 2011-12-12: qty 4

## 2011-12-12 MED ORDER — TRANEXAMIC ACID 100 MG/ML IV SOLN
1.5000 mg/kg/h | INTRAVENOUS | Status: AC
  Administered 2011-12-13: 1.5 mg/kg/h via INTRAVENOUS
  Filled 2011-12-12: qty 25

## 2011-12-12 MED ORDER — SODIUM CHLORIDE 0.9 % IV SOLN
INTRAVENOUS | Status: AC
  Administered 2011-12-13: 1.7 [IU]/h via INTRAVENOUS
  Filled 2011-12-12: qty 1

## 2011-12-12 MED ORDER — DOPAMINE-DEXTROSE 3.2-5 MG/ML-% IV SOLN
2.0000 ug/kg/min | INTRAVENOUS | Status: DC
  Filled 2011-12-12: qty 250

## 2011-12-12 MED ORDER — VANCOMYCIN HCL 1000 MG IV SOLR
1500.0000 mg | INTRAVENOUS | Status: AC
  Administered 2011-12-13: 1500 mg via INTRAVENOUS
  Filled 2011-12-12: qty 1500

## 2011-12-12 MED ORDER — EPINEPHRINE HCL 1 MG/ML IJ SOLN
0.5000 ug/min | INTRAVENOUS | Status: DC
  Filled 2011-12-12: qty 4

## 2011-12-12 MED ORDER — DIPHENHYDRAMINE HCL 50 MG/ML IJ SOLN
50.0000 mg | Freq: Once | INTRAMUSCULAR | Status: AC
  Administered 2011-12-12: 50 mg via INTRAVENOUS
  Filled 2011-12-12: qty 1

## 2011-12-12 MED ORDER — FAMOTIDINE 20 MG PO TABS: 20.0000 mg | ORAL_TABLET | Freq: Two times a day (BID) | ORAL | Status: DC

## 2011-12-12 MED ORDER — PREDNISONE 20 MG PO TABS
60.0000 mg | ORAL_TABLET | Freq: Once | ORAL | Status: AC
  Administered 2011-12-12: 60 mg via ORAL
  Filled 2011-12-12: qty 3

## 2011-12-12 NOTE — ED Notes (Signed)
Attempted to give report to CDU; CDU RN states that they are full at this time.  Will continue to try.

## 2011-12-12 NOTE — ED Notes (Signed)
Patient currently resting quietly in bed; no respiratory or acute distress noted.  Patient updated on plan of care; informed patient that we are currently working on getting him to CDU.  Decreased swelling noted.  Patient has no other questions or concerns at this time; will continue to monitor.

## 2011-12-12 NOTE — ED Provider Notes (Signed)
History     CSN: [id number]  Arrival date & time 12/11/11  2320   First MD Initiated Contact with Patient 12/12/11 0001      Chief Complaint  Patient presents with  . Allergic Reaction    (Consider location/radiation/quality/duration/timing/severity/associated sxs/prior treatment) HPI Comments: 66 year old male with a history of hypertension diabetes myocardial infarction was in implantable defibrillator who presents with the complaint of tongue swelling. According to the patient's cardiologist started him on lisinopril and Corag approximately one week ago. He noticed that his tongue started swelling acute in onset approximately 2 hours prior to arrival. It is gradually getting worse, interferes somewhat with swallowing but has no change in his voice, no shortness of breath and is having no trouble breathing. The symptoms are persistent, nothing makes it better or worse and he has had no medications prior to arrival. He denies chest pain shortness of breath or shimmy swelling rash. He does admit to having been scheduled for a coronary artery bypass graft in 2 days.  Patient is a 66 y.o. male presenting with allergic reaction. The history is provided by the patient, the spouse and medical records.  Allergic Reaction    Past Medical History  Diagnosis Date  . HTN (hypertension)   . Diabetes mellitus type 2, noninsulin dependent     a. diagnosed 2012  . Coronary artery disease   . Myocardial infarction   . Diabetes mellitus   . GERD (gastroesophageal reflux disease)     Past Surgical History  Procedure Date  . Cardiac catheterization     12-01-2011  . Left arm surgery as a child     Family History  Problem Relation Age of Onset  . Heart attack Father     died @ 64's  . Heart attack Mother     died @ 51's  . Heart attack Brother     died @ 86  . Heart attack Brother     died @ 79  . Anesthesia problems Neg Hx   . Hypotension Neg Hx   . Malignant hyperthermia Neg Hx   .  Pseudochol deficiency Neg Hx     History  Substance Use Topics  . Smoking status: Former Smoker -- 2 years    Types: Cigars  . Smokeless tobacco: Not on file   Comment: smokes 1 cigar/day for many years  . Alcohol Use: No      Review of Systems  All other systems reviewed and are negative.    Allergies  Lisinopril and Penicillins  Home Medications   No current outpatient prescriptions on file.  BP 121/78  Pulse 76  Temp(Src) 98.4 F (36.9 C) (Oral)  Resp 20  SpO2 96%  Physical Exam  Nursing note and vitals reviewed. Constitutional: He appears well-developed and well-nourished. No distress.  HENT:  Head: Normocephalic and atraumatic.       Pharynx not visualized, tongue with angioedema, sublingual angioedema, moist mucous membranes.  Herpetic lesion mid upper lip halfway between [place] border and the nose  Eyes: Conjunctivae and EOM are normal. Pupils are equal, round, and reactive to light. Right eye exhibits no discharge. Left eye exhibits no discharge. No scleral icterus.  Neck: Normal range of motion. Neck supple. No JVD present. No thyromegaly present.       Bilateral lymphadenopathy of the neck  Cardiovascular: Normal rate, regular rhythm, normal heart sounds and intact distal pulses.  Exam reveals no gallop and no friction rub.   No murmur heard. Pulmonary/Chest: Effort normal  and breath sounds normal. No respiratory distress. He has no wheezes. He has no rales.  Abdominal: Soft. Bowel sounds are normal. He exhibits no distension and no mass. There is no tenderness.  Musculoskeletal: Normal range of motion. He exhibits no edema and no tenderness.  Lymphadenopathy:    He has no cervical adenopathy.  Neurological: He is alert. Coordination normal.  Skin: Skin is warm and dry. No rash noted. No erythema.  Psychiatric: He has a normal mood and affect. His behavior is normal.    ED Course  Procedures (including critical care time)  Labs Reviewed - No data  to display Dg Chest 2 View  12/13/2011  *RADIOLOGY REPORT*  Clinical Data: Preoperative study for CABG.  CHEST - 2 VIEW  Comparison: Chest x-ray 04/14/2008.  Findings: Lung volumes are normal.  No consolidative airspace disease.  No pleural effusions.  No pneumothorax.  No pulmonary nodule or mass noted.  Pulmonary vasculature and the cardiomediastinal silhouette are within normal limits. Atherosclerotic calcifications are noted within the arch of the aorta.  IMPRESSION:  1.  No radiographic evidence of acute cardiopulmonary disease. 2.  Atherosclerosis.  Original Report Authenticated By: Florencia Reasons, M.D.     1. Angioedema       MDM  Angioedema likely secondary to the ACE inhibitor which she recently started, was her medications, will need observation, frequent reevaluations and avoid epinephrine as patient has severe coronary disease  12:30 AM, patient reevaluated with no progression of swelling, airway remains intact  After overnight observation, the swelling almost completely resolve - pt instructed not to use ACE i any more and to call PMD for f/u - CT surg made aware of reaction given surgical appointment in next day, agree with continuing to surgery - stay on coreg.  Vida Roller, MD 12/13/11 [phone]

## 2011-12-12 NOTE — ED Notes (Signed)
NO VISIBLE ORAL SWELLING. PT DENIES DIFF SPEAKING, BREATHING OR SWALLOWING.

## 2011-12-12 NOTE — Discharge Instructions (Signed)
Please stop taking your lisinopril.  You should no longer take any medication in the ACE Inhibitor family.  If you have any increase of swelling in your mouth or throat, or any difficulty swallowing or breathing, return immediately to the ER.  You may return to the ER at any time for worsening condition or any new symptoms that concern you.  Angioedema Angioedema (AE) is a sudden swelling of the eyelids, lips, lobes of ears, external genitalia, skin, and other parts of the body. AE can happen by itself. It usually begins during the night and is found on awakening. It can happen with hives and other allergic reactions. Attacks can be mild and annoying, or life-threatening if the air passages swell. AE generally occurs in a short time period (over minutes to hours) and gets better in 24 to 48 hours. It usually does not cause any serious problems.  There are 2 different kinds of AE:   Allergic AE.   Nonallergic AE.   There may be an overreaction or direct stimulation of cells that are a part of the immune system (mast cells).   There may be problems with the release of chemicals made by the body that cause swelling and inflammation (kinins). AE due to kinins can be inherited from parents (hereditary), or it can develop on its own (acquired). Acquired AE either shows up before, or along with, certain diseases or is due to the body's immune system attacking parts of the body's own cells (autoimmune).  CAUSES  Allergic  AE due to allergic reactions are caused by something that causes the body to react (trigger). Common triggers include:   Foods.   Medicines.   Latex.   Direct contact with certain fruits, vegetables, or animal saliva.   Insect stings.  Nonallergic  Mast cell stimulation may be caused by:   Medicines.   Dyes used in X-rays.   The body's own immune system reactions to parts of the body (autoimmune disease).   Possibly, some virus infections.   AE due to problems with  kinins can be hereditary or acquired. Attacks are triggered by:   Mild injury.   Dental work or any surgery.   Stress.   Sudden changes in temperature.   Exercise.   Medicines.   AE due to problems with kinins can also be due to certain medicines, especially blood pressure medicines like angiotensin-converting enzyme (ACE) inhibitors. African Americans are at nearly 5 times greater risk of developing AE than Caucasians from ACE inhibitors.  SYMPTOMS  Allergic symptoms:  Non-itchy swelling of the skin. Often the swelling is on the face and lips, but any area of the skin can swell. Sometimes, the swelling can be painful. If hives are present, there is intense itching.   Breathing problems if the air passages swell.  Nonallergic symptoms:  If internal organs are involved, there may be:   Nausea.   Abdominal pain.   Vomiting.   Difficulty swallowing.   Difficulty passing urine.   Breathing problems if the air passages swell.  Depending on the cause of AE, episodes may:  Only happen once (if triggers are removed or avoided).   Come back in unpredictable patterns.   Repeat for several years and then gradually fade away.  DIAGNOSIS  AE is diagnosed by:   Asking questions to find out how fast the symptoms began.   Taking a family history.   Physical exam.   Diagnostic tests. Tests could include:   Allergy skin tests to see  if the problem is allergic.   Blood tests to diagnose hereditary and some acquired types of AE.   Other tests to see if there is a hidden disease leading to the AE.  TREATMENT  Treatment depends on the type and cause (if any) of the AE. Allergic  Allergic types of AE are treated with:   Immediate removal of the trigger or medicine (if any).   Epinephrine injection.   Steroids.   Antihistamines.   Hospitalization for severe attacks.  Nonallergic  Mast cell stimulation types of AE are treated with:   Immediate removal of the  trigger or medicine (if any).   Epinephrine injection.   Steroids.   Antihistamines.   Hospitalization for severe attacks.   Hereditary AE is treated with:   Medicines to prevent and treat attacks. There is little response to antihistamines, epinephrine, or steroids.   Preventive medicines before dental work or surgery.   Removing or avoiding medicines that trigger attacks.   Hospitalization for severe attacks.   Acquired AE is treated with:   Treating underlying disease (if any).   Medicines to prevent and treat attacks.  HOME CARE INSTRUCTIONS   Always carry your emergency allergy treatment medicines with you.   Wear a medical bracelet.   Avoid known triggers.  SEEK MEDICAL CARE IF:   You get repeat attacks.   Your attacks are more frequent or more severe despite preventive measures.   You have hereditary AE and are considering having children. It is important to discuss the risks of passing this on to your children.  SEEK IMMEDIATE MEDICAL CARE IF:   You have difficulty breathing.   You have difficulty swallowing.   You experience fainting.  This condition should be treated immediately. It can be life-threatening if it involves throat swelling. Document Released: 10/30/2001 Document Revised: 08/10/2011 Document Reviewed: 08/20/2008 [facility] Patient Information 2012 [place], [place].

## 2011-12-12 NOTE — ED Notes (Signed)
Patient currently resting quietly in bed; no respiratory or acute distress noted.  Charge RN wanting to move patient to CDU; charge RN aware of CDU holding; will continue to monitor.

## 2011-12-12 NOTE — ED Provider Notes (Signed)
7:57 AM Patient is in CDU under observation for angioedema.  Patient reports great improvement overnight, states he is nearly back to normal.  Denies any swelling in his throat or difficulty swallowing or breathing.  On exam, pt is A&Ox4, NAD, voice is clear, oropharynx patent, RRR, CTAB.  Patient also seen this morning and reexamined by Dr Hyacinth Meeker.  Patient is concerned about stopping his lisinopril as he is scheduled for open heart surgery tomorrow and wants to make sure his blood pressure is controlled.  Patient is also on Coreg.  Dr Hyacinth Meeker to speak with Dr Laneta Simmers regarding hypertensive medications and surgery tomorrow.  Anticipate d/c home this morning.    8:45 AM Received call from Dr Laneta Simmers.  Per Dr Laneta Simmers, patient does not need to be on anything in addition to Coreg.  States they will do the surgery and then will adjust his medications afterwards.  I have discussed this with the patient.  Patient is aware that he is never to take lisinopril or any ACE inhibitor again.    9:26 AM I spoke with Dr Freida Busman regarding patient's steroids at discharge as patient is having surgery tomorrow.  Dr Freida Busman recommends giving patient one dose of steroids here before discharge and to discharge home without prescription for steroids.  I have amended the discharge prescriptions and paperwork per his orders.  I have discussed and given patient strict precautions for return.  Patient verbalizes understanding and agrees with plan.    Rise Patience, [place] 12/12/11 1233

## 2011-12-12 NOTE — ED Notes (Signed)
Patient sleeping and oxygen saturation dropped to 87% RA. Patient placed on 2L oxygen and sats are 95%.

## 2011-12-12 NOTE — ED Notes (Signed)
Patient complaining of neck, tongue, and throat swelling that started around 2000 this evening.  Patient feels that it is an allergic reaction to Lisinopril which he recently has began to take this week.  Patient is scheduled for open heart surgery tomorrow; EDP advises against using epinephrine.  Swelling to neck and tongue noted; no airway or respiratory comprise at this time.  Patient denies pain, shortness of breath, and chest pain.  Patient alert and oriented x4; PERRL present.  Upon arrival to room, patient changed into gown and connected to continuous cardiac, pulse ox, and blood pressure monitor.  IV started.  EDP at bedside.  Will continue to monitor.

## 2011-12-13 ENCOUNTER — Encounter (HOSPITAL_COMMUNITY): Payer: Self-pay | Admitting: Anesthesiology

## 2011-12-13 ENCOUNTER — Inpatient Hospital Stay (HOSPITAL_COMMUNITY): Payer: Medicare Other

## 2011-12-13 ENCOUNTER — Encounter (HOSPITAL_COMMUNITY): Payer: Self-pay | Admitting: *Deleted

## 2011-12-13 ENCOUNTER — Ambulatory Visit (HOSPITAL_COMMUNITY): Payer: Medicare Other | Admitting: Anesthesiology

## 2011-12-13 ENCOUNTER — Inpatient Hospital Stay (HOSPITAL_COMMUNITY)
Admission: RE | Admit: 2011-12-13 | Discharge: 2011-12-18 | DRG: 236 | Disposition: A | Discharge status: Home / Self Care | Payer: Medicare Other | Source: Ambulatory Visit | Attending: Surgery | Admitting: Surgery

## 2011-12-13 ENCOUNTER — Ambulatory Visit (HOSPITAL_COMMUNITY): Payer: Medicare Other

## 2011-12-13 ENCOUNTER — Encounter (HOSPITAL_COMMUNITY)
Admission: RE | Disposition: A | Discharge status: Home / Self Care | Payer: Self-pay | Source: Ambulatory Visit | Attending: Surgery

## 2011-12-13 DIAGNOSIS — I2510 Atherosclerotic heart disease of native coronary artery without angina pectoris: Principal | ICD-10-CM | POA: Diagnosis present

## 2011-12-13 DIAGNOSIS — I6529 Occlusion and stenosis of unspecified carotid artery: Secondary | ICD-10-CM | POA: Diagnosis present

## 2011-12-13 DIAGNOSIS — I4891 Unspecified atrial fibrillation: Secondary | ICD-10-CM | POA: Diagnosis not present

## 2011-12-13 DIAGNOSIS — I2582 Chronic total occlusion of coronary artery: Secondary | ICD-10-CM | POA: Diagnosis present

## 2011-12-13 DIAGNOSIS — Z6837 Body mass index (BMI) 37.0-37.9, adult: Secondary | ICD-10-CM

## 2011-12-13 DIAGNOSIS — E669 Obesity, unspecified: Secondary | ICD-10-CM | POA: Diagnosis present

## 2011-12-13 DIAGNOSIS — Z7982 Long term (current) use of aspirin: Secondary | ICD-10-CM

## 2011-12-13 DIAGNOSIS — Z880 Allergy status to penicillin: Secondary | ICD-10-CM

## 2011-12-13 DIAGNOSIS — K219 Gastro-esophageal reflux disease without esophagitis: Secondary | ICD-10-CM | POA: Diagnosis present

## 2011-12-13 DIAGNOSIS — E119 Type 2 diabetes mellitus without complications: Secondary | ICD-10-CM | POA: Diagnosis present

## 2011-12-13 DIAGNOSIS — Z951 Presence of aortocoronary bypass graft: Secondary | ICD-10-CM

## 2011-12-13 DIAGNOSIS — Z79899 Other long term (current) drug therapy: Secondary | ICD-10-CM

## 2011-12-13 DIAGNOSIS — I10 Essential (primary) hypertension: Secondary | ICD-10-CM | POA: Diagnosis present

## 2011-12-13 DIAGNOSIS — E785 Hyperlipidemia, unspecified: Secondary | ICD-10-CM | POA: Diagnosis present

## 2011-12-13 DIAGNOSIS — F17200 Nicotine dependence, unspecified, uncomplicated: Secondary | ICD-10-CM | POA: Diagnosis present

## 2011-12-13 DIAGNOSIS — I252 Old myocardial infarction: Secondary | ICD-10-CM

## 2011-12-13 HISTORY — PX: CORONARY ARTERY BYPASS GRAFT: SHX141

## 2011-12-13 LAB — POCT I-STAT 4, (NA,K, GLUC, HGB,HCT)
Glucose, Bld: 106 mg/dL — ABNORMAL HIGH (ref 70–99)
Glucose, Bld: 95 mg/dL (ref 70–99)
Glucose, Bld: 129 mg/dL — ABNORMAL HIGH (ref 70–99)
HCT: 32 % — ABNORMAL LOW (ref 39.0–52.0)
HCT: 27 % — ABNORMAL LOW (ref 39.0–52.0)
Hemoglobin: 10.9 g/dL — ABNORMAL LOW (ref 13.0–17.0)
Hemoglobin: 9.2 g/dL — ABNORMAL LOW (ref 13.0–17.0)
Hemoglobin: 9.2 g/dL — ABNORMAL LOW (ref 13.0–17.0)
Potassium: 4.2 mEq/L (ref 3.5–5.1)
Potassium: 4.9 mEq/L (ref 3.5–5.1)
Potassium: 4.1 mEq/L (ref 3.5–5.1)
Potassium: 4.1 mEq/L (ref 3.5–5.1)
Sodium: 141 mEq/L (ref 135–145)
Sodium: 142 mEq/L (ref 135–145)
Sodium: 141 mEq/L (ref 135–145)

## 2011-12-13 LAB — POCT I-STAT 3, ART BLOOD GAS (G3+)
Acid-base deficit: 1 mmol/L (ref 0.0–2.0)
Acid-base deficit: 3 mmol/L — ABNORMAL HIGH (ref 0.0–2.0)
Bicarbonate: 24.8 mEq/L — ABNORMAL HIGH (ref 20.0–24.0)
Bicarbonate: 21.7 mEq/L (ref 20.0–24.0)
Bicarbonate: 23.5 mEq/L (ref 20.0–24.0)
O2 Saturation: 100 %
O2 Saturation: 99 %
TCO2: 26 mmol/L (ref 0–100)
TCO2: 25 mmol/L (ref 0–100)
pCO2 arterial: 41.8 mmHg (ref 35.0–45.0)
pCO2 arterial: 40.2 mmHg (ref 35.0–45.0)
pCO2 arterial: 39.4 mmHg (ref 35.0–45.0)
pH, Arterial: 7.371 (ref 7.350–7.450)
pH, Arterial: 7.383 (ref 7.350–7.450)
pO2, Arterial: 80 mmHg (ref 80.0–100.0)
pO2, Arterial: 105 mmHg — ABNORMAL HIGH (ref 80.0–100.0)

## 2011-12-13 LAB — CBC
HCT: 28 % — ABNORMAL LOW (ref 39.0–52.0)
HCT: 28.8 % — ABNORMAL LOW (ref 39.0–52.0)
Hemoglobin: 9.8 g/dL — ABNORMAL LOW (ref 13.0–17.0)
MCH: 31.5 pg (ref 26.0–34.0)
MCH: 31 pg (ref 26.0–34.0)
MCHC: 34.3 g/dL (ref 30.0–36.0)
MCHC: 34 g/dL (ref 30.0–36.0)
MCV: 91.8 fL (ref 78.0–100.0)
RDW: 12.3 % (ref 11.5–15.5)
WBC: 12.6 10*3/uL — ABNORMAL HIGH (ref 4.0–10.5)

## 2011-12-13 LAB — APTT: aPTT: 27 seconds (ref 24–37)

## 2011-12-13 LAB — POCT I-STAT, CHEM 8
BUN: 20 mg/dL (ref 6–23)
Creatinine, Ser: 0.7 mg/dL (ref 0.50–1.35)
Hemoglobin: 9.2 g/dL — ABNORMAL LOW (ref 13.0–17.0)
Potassium: 4.2 mEq/L (ref 3.5–5.1)
Sodium: 142 mEq/L (ref 135–145)

## 2011-12-13 LAB — CREATININE, SERUM
Creatinine, Ser: 0.6 mg/dL (ref 0.50–1.35)
GFR calc Af Amer: >90 mL/min (ref >90)

## 2011-12-13 LAB — PLATELET COUNT: Platelets: 142 10*3/uL — ABNORMAL LOW (ref 150–400)

## 2011-12-13 LAB — GLUCOSE, CAPILLARY: Glucose-Capillary: 135 mg/dL — ABNORMAL HIGH (ref 70–99)

## 2011-12-13 SURGERY — CORONARY ARTERY BYPASS GRAFTING (CABG)
Anesthesia: General | Site: Chest | Wound class: Clean

## 2011-12-13 MED ORDER — ASPIRIN EC 325 MG PO TBEC
325.0000 mg | DELAYED_RELEASE_TABLET | Freq: Every day | ORAL | Status: DC
  Administered 2011-12-14: 325 mg via ORAL
  Filled 2011-12-13: qty 1

## 2011-12-13 MED ORDER — ACETAMINOPHEN 160 MG/5ML PO SOLN: 650.0000 mg | ORAL | Status: AC

## 2011-12-13 MED ORDER — THROMBIN 20000 UNITS EX KIT
PACK | OROMUCOSAL | Status: DC | PRN
  Administered 2011-12-13: 10:00:00 via TOPICAL

## 2011-12-13 MED ORDER — BISACODYL 5 MG PO TBEC
10.0000 mg | DELAYED_RELEASE_TABLET | Freq: Every day | ORAL | Status: DC
  Administered 2011-12-14: 10 mg via ORAL
  Filled 2011-12-13: qty 2

## 2011-12-13 MED ORDER — NITROGLYCERIN IN D5W 200-5 MCG/ML-% IV SOLN: 0.0000 ug/min | INTRAVENOUS | Status: DC

## 2011-12-13 MED ORDER — DOCUSATE SODIUM 100 MG PO CAPS
200.0000 mg | ORAL_CAPSULE | Freq: Every day | ORAL | Status: DC
  Administered 2011-12-14: 200 mg via ORAL
  Filled 2011-12-13: qty 2

## 2011-12-13 MED ORDER — DEXMEDETOMIDINE HCL 100 MCG/ML IV SOLN
0.1000 ug/kg/h | INTRAVENOUS | Status: DC
  Filled 2011-12-13 (×2): qty 2

## 2011-12-13 MED ORDER — EPHEDRINE SULFATE 50 MG/ML IJ SOLN
INTRAMUSCULAR | Status: DC | PRN
  Administered 2011-12-13: 5 mg via INTRAVENOUS

## 2011-12-13 MED ORDER — 0.9 % SODIUM CHLORIDE (POUR BTL) OPTIME
TOPICAL | Status: DC | PRN
  Administered 2011-12-13: 3000 mL

## 2011-12-13 MED ORDER — PHENYLEPHRINE HCL 10 MG/ML IJ SOLN
20.0000 mg | INTRAVENOUS | Status: DC | PRN
  Administered 2011-12-13: 20 ug/min via INTRAVENOUS

## 2011-12-13 MED ORDER — SODIUM CHLORIDE 0.9 % IV SOLN
INTRAVENOUS | Status: DC
  Filled 2011-12-13: qty 1

## 2011-12-13 MED ORDER — VANCOMYCIN HCL 1000 MG IV SOLR
1000.0000 mg | Freq: Once | INTRAVENOUS | Status: AC
  Administered 2011-12-13: 1000 mg via INTRAVENOUS
  Filled 2011-12-13: qty 1000

## 2011-12-13 MED ORDER — PROPOFOL 10 MG/ML IV EMUL
INTRAVENOUS | Status: DC | PRN
  Administered 2011-12-13 (×2): 100 mg via INTRAVENOUS

## 2011-12-13 MED ORDER — ALBUMIN HUMAN 5 % IV SOLN
250.0000 mL | INTRAVENOUS | Status: DC | PRN
  Administered 2011-12-13: 250 mL via INTRAVENOUS

## 2011-12-13 MED ORDER — PANTOPRAZOLE SODIUM 40 MG PO TBEC: 40.0000 mg | DELAYED_RELEASE_TABLET | Freq: Every day | ORAL | Status: DC

## 2011-12-13 MED ORDER — CALCIUM CHLORIDE 10 % IV SOLN
INTRAVENOUS | Status: DC | PRN
  Administered 2011-12-13: 0.5 g via INTRAVENOUS

## 2011-12-13 MED ORDER — POTASSIUM CHLORIDE 10 MEQ/50ML IV SOLN: 10.0000 meq | INTRAVENOUS | Status: AC

## 2011-12-13 MED ORDER — MAGNESIUM SULFATE 40 MG/ML IJ SOLN
INTRAMUSCULAR | Status: AC
  Administered 2011-12-13: 4 g
  Filled 2011-12-13: qty 100

## 2011-12-13 MED ORDER — METOPROLOL TARTRATE 1 MG/ML IV SOLN: 2.5000 mg | INTRAVENOUS | Status: DC | PRN

## 2011-12-13 MED ORDER — FENTANYL CITRATE 0.05 MG/ML IJ SOLN
INTRAMUSCULAR | Status: DC | PRN
  Administered 2011-12-13 (×2): 50 ug via INTRAVENOUS
  Administered 2011-12-13: 100 ug via INTRAVENOUS
  Administered 2011-12-13 (×2): 250 ug via INTRAVENOUS
  Administered 2011-12-13: 450 ug via INTRAVENOUS
  Administered 2011-12-13 (×2): 50 ug via INTRAVENOUS
  Administered 2011-12-13: 250 ug via INTRAVENOUS

## 2011-12-13 MED ORDER — SODIUM CHLORIDE 0.9 % IV SOLN
0.4000 ug/kg/h | INTRAVENOUS | Status: DC
  Filled 2011-12-13: qty 2

## 2011-12-13 MED ORDER — INSULIN ASPART 100 UNIT/ML ~~LOC~~ SOLN
0.0000 [IU] | SUBCUTANEOUS | Status: DC
  Administered 2011-12-14: 2 [IU] via SUBCUTANEOUS

## 2011-12-13 MED ORDER — SODIUM CHLORIDE 0.45 % IV SOLN
INTRAVENOUS | Status: DC
  Administered 2011-12-13: 20 mL/h via INTRAVENOUS

## 2011-12-13 MED ORDER — HEPARIN SODIUM (PORCINE) 1000 UNIT/ML IJ SOLN
INTRAMUSCULAR | Status: DC | PRN
  Administered 2011-12-13: 47000 [IU] via INTRAVENOUS

## 2011-12-13 MED ORDER — SODIUM CHLORIDE 0.9 % IJ SOLN
3.0000 mL | Freq: Two times a day (BID) | INTRAMUSCULAR | Status: DC
  Administered 2011-12-14: 3 mL via INTRAVENOUS

## 2011-12-13 MED ORDER — MORPHINE SULFATE 2 MG/ML IJ SOLN
1.0000 mg | INTRAMUSCULAR | Status: DC | PRN
  Administered 2011-12-13 (×2): 1 mg via INTRAVENOUS
  Administered 2011-12-13 (×2): 2 mg via INTRAVENOUS
  Filled 2011-12-13 (×3): qty 1

## 2011-12-13 MED ORDER — ALBUMIN HUMAN 5 % IV SOLN
INTRAVENOUS | Status: DC | PRN
  Administered 2011-12-13: 13:00:00 via INTRAVENOUS

## 2011-12-13 MED ORDER — ACETAMINOPHEN 650 MG RE SUPP
650.0000 mg | RECTAL | Status: AC
  Administered 2011-12-13: 650 mg via RECTAL

## 2011-12-13 MED ORDER — LACTATED RINGERS IV SOLN: 500.0000 mL | Freq: Once | INTRAVENOUS | Status: AC | PRN

## 2011-12-13 MED ORDER — LACTATED RINGERS IV SOLN
INTRAVENOUS | Status: DC
  Administered 2011-12-14: 20 mL/h via INTRAVENOUS

## 2011-12-13 MED ORDER — ATORVASTATIN CALCIUM 80 MG PO TABS
80.0000 mg | ORAL_TABLET | Freq: Every day | ORAL | Status: DC
  Administered 2011-12-13 – 2011-12-17 (×5): 80 mg via ORAL
  Filled 2011-12-13 (×7): qty 1

## 2011-12-13 MED ORDER — HEMOSTATIC AGENTS (NO CHARGE) OPTIME: TOPICAL | Status: DC | PRN

## 2011-12-13 MED ORDER — SODIUM CHLORIDE 0.9 % IV SOLN: INTRAVENOUS | Status: DC

## 2011-12-13 MED ORDER — LACTATED RINGERS IV SOLN
INTRAVENOUS | Status: DC | PRN
  Administered 2011-12-13: 08:00:00 via INTRAVENOUS

## 2011-12-13 MED ORDER — ASPIRIN 81 MG PO CHEW: 324.0000 mg | CHEWABLE_TABLET | Freq: Every day | ORAL | Status: DC

## 2011-12-13 MED ORDER — ACETAMINOPHEN 500 MG PO TABS
1000.0000 mg | ORAL_TABLET | Freq: Four times a day (QID) | ORAL | Status: DC
  Administered 2011-12-13 – 2011-12-14 (×3): 1000 mg via ORAL
  Filled 2011-12-13 (×6): qty 2

## 2011-12-13 MED ORDER — INSULIN ASPART 100 UNIT/ML ~~LOC~~ SOLN
0.0000 [IU] | SUBCUTANEOUS | Status: DC
  Administered 2011-12-13 – 2011-12-14 (×2): 2 [IU] via SUBCUTANEOUS

## 2011-12-13 MED ORDER — PROTAMINE SULFATE 10 MG/ML IV SOLN
INTRAVENOUS | Status: DC | PRN
  Administered 2011-12-13: 40 mg via INTRAVENOUS

## 2011-12-13 MED ORDER — FAMOTIDINE IN NACL 20-0.9 MG/50ML-% IV SOLN
20.0000 mg | Freq: Two times a day (BID) | INTRAVENOUS | Status: AC
  Administered 2011-12-13 (×2): 20 mg via INTRAVENOUS
  Filled 2011-12-13: qty 50

## 2011-12-13 MED ORDER — PHENYLEPHRINE HCL 10 MG/ML IJ SOLN
0.0000 ug/min | INTRAVENOUS | Status: DC
  Filled 2011-12-13 (×6): qty 2

## 2011-12-13 MED ORDER — ACETAMINOPHEN 160 MG/5ML PO SOLN: 975.0000 mg | Freq: Four times a day (QID) | ORAL | Status: DC

## 2011-12-13 MED ORDER — INSULIN REGULAR BOLUS VIA INFUSION
0.0000 [IU] | Freq: Three times a day (TID) | INTRAVENOUS | Status: DC
Start: 2011-12-13 — End: 2011-12-13
  Filled 2011-12-13: qty 10

## 2011-12-13 MED ORDER — MORPHINE SULFATE 4 MG/ML IJ SOLN
2.0000 mg | INTRAMUSCULAR | Status: DC | PRN
  Administered 2011-12-13 – 2011-12-14 (×4): 4 mg via INTRAVENOUS
  Filled 2011-12-13 (×4): qty 1

## 2011-12-13 MED ORDER — MIDAZOLAM HCL 2 MG/2ML IJ SOLN: 2.0000 mg | INTRAMUSCULAR | Status: DC | PRN

## 2011-12-13 MED ORDER — HETASTARCH-ELECTROLYTES 6 % IV SOLN
INTRAVENOUS | Status: DC | PRN
  Administered 2011-12-13: 14:00:00 via INTRAVENOUS

## 2011-12-13 MED ORDER — SODIUM CHLORIDE 0.9 % IV SOLN: 250.0000 mL | INTRAVENOUS | Status: DC

## 2011-12-13 MED ORDER — BISACODYL 10 MG RE SUPP: 10.0000 mg | Freq: Every day | RECTAL | Status: DC

## 2011-12-13 MED ORDER — TRANEXAMIC ACID (OHS) BOLUS VIA INFUSION
15.0000 mg/kg | INTRAVENOUS | Status: AC
  Administered 2011-12-13: 1170 mg via INTRAVENOUS

## 2011-12-13 MED ORDER — SODIUM CHLORIDE 0.9 % IJ SOLN: 3.0000 mL | INTRAMUSCULAR | Status: DC | PRN

## 2011-12-13 MED ORDER — ROCURONIUM BROMIDE 100 MG/10ML IV SOLN
INTRAVENOUS | Status: DC | PRN
  Administered 2011-12-13: 70 mg via INTRAVENOUS
  Administered 2011-12-13: 50 mg via INTRAVENOUS
  Administered 2011-12-13: 30 mg via INTRAVENOUS
  Administered 2011-12-13: 50 mg via INTRAVENOUS

## 2011-12-13 MED ORDER — MIDAZOLAM HCL 5 MG/5ML IJ SOLN
INTRAMUSCULAR | Status: DC | PRN
  Administered 2011-12-13: 2 mg via INTRAVENOUS
  Administered 2011-12-13: 4 mg via INTRAVENOUS
  Administered 2011-12-13 (×5): 2 mg via INTRAVENOUS

## 2011-12-13 MED ORDER — OXYCODONE HCL 5 MG PO TABS
5.0000 mg | ORAL_TABLET | ORAL | Status: DC | PRN
  Administered 2011-12-13 (×2): 5 mg via ORAL
  Administered 2011-12-14 (×2): 10 mg via ORAL
  Filled 2011-12-13 (×2): qty 1
  Filled 2011-12-13 (×2): qty 2

## 2011-12-13 MED ORDER — HEMOSTATIC AGENTS (NO CHARGE) OPTIME
TOPICAL | Status: DC | PRN
  Administered 2011-12-13: 1 via TOPICAL

## 2011-12-13 MED ORDER — METOPROLOL TARTRATE 25 MG/10 ML ORAL SUSPENSION
12.5000 mg | Freq: Two times a day (BID) | ORAL | Status: DC
  Filled 2011-12-13 (×3): qty 5

## 2011-12-13 MED ORDER — ONDANSETRON HCL 4 MG/2ML IJ SOLN: 4.0000 mg | Freq: Four times a day (QID) | INTRAMUSCULAR | Status: DC | PRN

## 2011-12-13 MED ORDER — MAGNESIUM SULFATE 40 MG/ML IJ SOLN: 4.0000 g | Freq: Once | INTRAMUSCULAR | Status: AC

## 2011-12-13 MED ORDER — LIDOCAINE HCL (CARDIAC) 20 MG/ML IV SOLN
INTRAVENOUS | Status: DC | PRN
  Administered 2011-12-13: 100 mg via INTRAVENOUS

## 2011-12-13 MED ORDER — METOPROLOL TARTRATE 12.5 MG HALF TABLET
12.5000 mg | ORAL_TABLET | Freq: Two times a day (BID) | ORAL | Status: DC
  Filled 2011-12-13 (×3): qty 1

## 2011-12-13 SURGICAL SUPPLY — 103 items
APL SKNCLS STERI-STRIP NONHPOA (GAUZE/BANDAGES/DRESSINGS) ×2
ATTRACTOMAT 16X20 MAGNETIC DRP (DRAPES) ×2 IMPLANT
BAG DECANTER FOR FLEXI CONT (MISCELLANEOUS) ×2 IMPLANT
BANDAGE ELASTIC 4 VELCRO ST LF (GAUZE/BANDAGES/DRESSINGS) ×2 IMPLANT
BANDAGE ELASTIC 6 VELCRO ST LF (GAUZE/BANDAGES/DRESSINGS) ×2 IMPLANT
BANDAGE GAUZE ELAST BULKY 4 IN (GAUZE/BANDAGES/DRESSINGS) ×2 IMPLANT
BASKET HEART (ORDER IN 25'S) (MISCELLANEOUS) ×1
BASKET HEART (ORDER IN 25S) (MISCELLANEOUS) ×1 IMPLANT
BENZOIN TINCTURE PRP APPL 2/3 (GAUZE/BANDAGES/DRESSINGS) ×2 IMPLANT
BLADE STERNUM SYSTEM 6 (BLADE) ×2 IMPLANT
CANISTER SUCTION 2500CC (MISCELLANEOUS) ×2 IMPLANT
CANNULA ARTERIAL NVNT 3/8 22FR (MISCELLANEOUS) ×1 IMPLANT
CATH ROBINSON RED A/P 18FR (CATHETERS) ×4 IMPLANT
CATH THORACIC 28FR (CATHETERS) ×2 IMPLANT
CATH THORACIC 28FR RT ANG (CATHETERS) IMPLANT
CATH THORACIC 36FR (CATHETERS) ×2 IMPLANT
CATH THORACIC 36FR RT ANG (CATHETERS) ×2 IMPLANT
CLIP FOGARTY SPRING 6M (CLIP) ×1 IMPLANT
CLIP TI MEDIUM 24 (CLIP) IMPLANT
CLIP TI WIDE RED SMALL 24 (CLIP) ×2 IMPLANT
CLOTH BEACON ORANGE TIMEOUT ST (SAFETY) ×2 IMPLANT
COVER SURGICAL LIGHT HANDLE (MISCELLANEOUS) ×4 IMPLANT
CRADLE DONUT ADULT HEAD (MISCELLANEOUS) ×2 IMPLANT
DRAPE CARDIOVASCULAR INCISE (DRAPES) ×2
DRAPE SLUSH MACHINE 52X66 (DRAPES) IMPLANT
DRAPE SLUSH/WARMER DISC (DRAPES) IMPLANT
DRAPE SRG 135X102X78XABS (DRAPES) ×1 IMPLANT
DRSG COVADERM 4X14 (GAUZE/BANDAGES/DRESSINGS) ×2 IMPLANT
ELECT CAUTERY BLADE 6.4 (BLADE) ×2 IMPLANT
ELECT REM PT RETURN 9FT ADLT (ELECTROSURGICAL) ×4
ELECTRODE REM PT RTRN 9FT ADLT (ELECTROSURGICAL) ×2 IMPLANT
GLOVE BIO SURGEON STRL SZ 6 (GLOVE) ×1 IMPLANT
GLOVE BIO SURGEON STRL SZ 6.5 (GLOVE) ×10 IMPLANT
GLOVE BIO SURGEON STRL SZ7 (GLOVE) IMPLANT
GLOVE BIO SURGEON STRL SZ7.5 (GLOVE) IMPLANT
GLOVE BIOGEL PI IND STRL 6 (GLOVE) IMPLANT
GLOVE BIOGEL PI IND STRL 6.5 (GLOVE) IMPLANT
GLOVE BIOGEL PI IND STRL 7.0 (GLOVE) IMPLANT
GLOVE BIOGEL PI INDICATOR 6 (GLOVE) ×5
GLOVE BIOGEL PI INDICATOR 6.5 (GLOVE)
GLOVE BIOGEL PI INDICATOR 7.0 (GLOVE)
GLOVE EUDERMIC 7 POWDERFREE (GLOVE) ×5 IMPLANT
GLOVE ORTHO TXT STRL SZ7.5 (GLOVE) IMPLANT
GOWN PREVENTION PLUS XLARGE (GOWN DISPOSABLE) ×4 IMPLANT
GOWN STRL NON-REIN LRG LVL3 (GOWN DISPOSABLE) ×10 IMPLANT
HEMOSTAT POWDER SURGIFOAM 1G (HEMOSTASIS) ×6 IMPLANT
HEMOSTAT SURGICEL 2X14 (HEMOSTASIS) ×2 IMPLANT
INSERT FOGARTY 61MM (MISCELLANEOUS) IMPLANT
INSERT FOGARTY XLG (MISCELLANEOUS) IMPLANT
KIT BASIN OR (CUSTOM PROCEDURE TRAY) ×2 IMPLANT
KIT CATH CPB BARTLE (MISCELLANEOUS) ×2 IMPLANT
KIT ROOM TURNOVER OR (KITS) ×2 IMPLANT
KIT SUCTION CATH 14FR (SUCTIONS) ×2 IMPLANT
KIT VASOVIEW W/TROCAR VH 2000 (KITS) ×2 IMPLANT
NS IRRIG 1000ML POUR BTL (IV SOLUTION) ×10 IMPLANT
PACK OPEN HEART (CUSTOM PROCEDURE TRAY) ×2 IMPLANT
PAD ARMBOARD 7.5X6 YLW CONV (MISCELLANEOUS) ×4 IMPLANT
PENCIL BUTTON HOLSTER BLD 10FT (ELECTRODE) ×2 IMPLANT
PUNCH AORTIC ROTATE 4.0MM (MISCELLANEOUS) IMPLANT
PUNCH AORTIC ROTATE 4.5MM 8IN (MISCELLANEOUS) ×2 IMPLANT
PUNCH AORTIC ROTATE 5MM 8IN (MISCELLANEOUS) IMPLANT
SET CARDIOPLEGIA MPS 5001102 (MISCELLANEOUS) ×1 IMPLANT
SPONGE GAUZE 4X4 12PLY (GAUZE/BANDAGES/DRESSINGS) ×4 IMPLANT
SPONGE INTESTINAL PEANUT (DISPOSABLE) IMPLANT
SPONGE LAP 18X18 X RAY DECT (DISPOSABLE) ×2 IMPLANT
SPONGE LAP 4X18 X RAY DECT (DISPOSABLE) ×2 IMPLANT
STRIP CLOSURE SKIN 1/2X4 (GAUZE/BANDAGES/DRESSINGS) ×1 IMPLANT
STRIP CLOSURE SKIN 1/4X4 (GAUZE/BANDAGES/DRESSINGS) ×1 IMPLANT
SUT BONE WAX W31G (SUTURE) ×2 IMPLANT
SUT MNCRL AB 4-0 PS2 18 (SUTURE) ×1 IMPLANT
SUT PROLENE 3 0 SH DA (SUTURE) IMPLANT
SUT PROLENE 3 0 SH1 36 (SUTURE) ×2 IMPLANT
SUT PROLENE 4 0 RB 1 (SUTURE)
SUT PROLENE 4 0 SH DA (SUTURE) IMPLANT
SUT PROLENE 4-0 RB1 .5 CRCL 36 (SUTURE) IMPLANT
SUT PROLENE 5 0 C 1 36 (SUTURE) IMPLANT
SUT PROLENE 6 0 C 1 30 (SUTURE) ×5 IMPLANT
SUT PROLENE 7 0 BV 1 (SUTURE) ×1 IMPLANT
SUT PROLENE 7 0 BV1 MDA (SUTURE) ×2 IMPLANT
SUT PROLENE 8 0 BV175 6 (SUTURE) IMPLANT
SUT SILK  1 MH (SUTURE)
SUT SILK 1 MH (SUTURE) IMPLANT
SUT STEEL STERNAL CCS#1 18IN (SUTURE) IMPLANT
SUT STEEL SZ 6 DBL 3X14 BALL (SUTURE) ×3 IMPLANT
SUT VIC AB 1 CTX 36 (SUTURE) ×6
SUT VIC AB 1 CTX36XBRD ANBCTR (SUTURE) ×2 IMPLANT
SUT VIC AB 2-0 CT1 27 (SUTURE) ×2
SUT VIC AB 2-0 CT1 TAPERPNT 27 (SUTURE) IMPLANT
SUT VIC AB 2-0 CTX 27 (SUTURE) IMPLANT
SUT VIC AB 3-0 SH 27 (SUTURE)
SUT VIC AB 3-0 SH 27X BRD (SUTURE) IMPLANT
SUT VIC AB 3-0 X1 27 (SUTURE) IMPLANT
SUT VICRYL 4-0 PS2 18IN ABS (SUTURE) IMPLANT
SUTURE E-PAK OPEN HEART (SUTURE) ×2 IMPLANT
SYSTEM SAHARA CHEST DRAIN ATS (WOUND CARE) ×2 IMPLANT
TAPE CLOTH SURG 4X10 WHT LF (GAUZE/BANDAGES/DRESSINGS) ×1 IMPLANT
TOWEL OR 17X24 6PK STRL BLUE (TOWEL DISPOSABLE) ×2 IMPLANT
TOWEL OR 17X26 10 PK STRL BLUE (TOWEL DISPOSABLE) ×2 IMPLANT
TRAY FOLEY IC TEMP SENS 14FR (CATHETERS) ×2 IMPLANT
TUBE SUCT INTRACARD DLP 20F (MISCELLANEOUS) ×2 IMPLANT
TUBING INSUFFLATION 10FT LAP (TUBING) ×2 IMPLANT
UNDERPAD 30X30 INCONTINENT (UNDERPADS AND DIAPERS) ×2 IMPLANT
WATER STERILE IRR 1000ML POUR (IV SOLUTION) ×4 IMPLANT

## 2011-12-13 NOTE — H&P (Signed)
[address]            [place] [address]          [phone]       [address] [place]  [place] [address]  [phone]  Reason for Consult: Severe multi-vessel coronary occlusive disease  Referring Physician: Dr. Riley Kill   HPI:  The patient is a 66 year old gentleman with a significant family history of heart disease and multiple cardiac risk factors including obesity, cigar smoking, hypertension, and diabetes who was watching TV yesterday afternoon when he developed severe substernal chest discomfort and shortness of breath. He took 2 aspirin and drove himself to a [facility] emergency room where he was found to have significant inferior ST elevation and a code STEMI was called. He was taken to the Cath Lab and the culprit was found to be a subtotally occluded right coronary. Attempts to open this artery were unsuccessful. The patient was given a loading dose of Plavix. The catheterization also showed significant stenoses in the LAD and left circumflex territories. Since the right coronary artery was a small vessel with a small myocardial territory it was felt the procedure should be concluded and bypass surgery planned. He has had no further chest discomfort or shortness of breath.  Past Medical History   Diagnosis  Date   .  HTN (hypertension)    .  Diabetes mellitus type 2, noninsulin dependent      a. diagnosed 2012   .  Coronary artery disease    .  Myocardial infarction    .  Diabetes mellitus    .  GERD (gastroesophageal reflux disease)     Past Surgical History   Procedure  Date   .  Cardiac catheterization     Family History   Problem  Relation  Age of Onset   .  Heart attack  Father       died @ 84's    .  Heart attack  Mother       died @ 82's    .  Heart attack  Brother       died @ 41    .  Heart attack  Brother       died @ 53   Social History: reports that he has been smoking Cigars. He does not have any  smokeless tobacco history on file. He reports that he does not drink alcohol or use illicit drugs.  Allergies:  Allergies   Allergen  Reactions   .  Penicillins    Medications:  I have reviewed the patient's current medications.  Prior to Admission:  Prescriptions prior to admission   Medication  Sig  Dispense  Refill   .  aspirin EC 325 MG tablet  Take 325 mg by mouth at bedtime.     Marland Kitchen  lisinopril-hydrochlorothiazide (PRINZIDE,ZESTORETIC) 20-12.5 MG per tablet  Take 1 tablet by mouth daily.     .  metFORMIN (GLUCOPHAGE) 850 MG tablet  Take 850 mg by mouth 2 (two) times daily with a meal.     .  Multiple Vitamin (MULITIVITAMIN WITH MINERALS) TABS  Take 1 tablet by mouth daily.     Scheduled:  .  aspirin EC  81 mg  Oral  Daily   .  atorvastatin  80 mg  Oral  q1800   .  atorvastatin  80 mg  Oral  NOW   .  carvedilol  3.125 mg  Oral  BID WC   .  carvedilol  3.125 mg  Oral  NOW   .  enoxaparin  40 mg  Subcutaneous  Q24H   .  heparin      .  insulin aspart  0-15 Units  Subcutaneous  TID WC   .  labetalol  10 mg  Intravenous  Once   .  lisinopril  5 mg  Oral  Daily   .  metoprolol  5 mg  Intravenous  Once   .  nitroGLYCERIN      .  sodium chloride  3 mL  Intravenous  Q12H   .  DISCONTD: aspirin  81 mg  Oral  Daily   .  DISCONTD: aspirin  325 mg  Oral  STAT   .  DISCONTD: bivalirudin (ANGIOMAX) infusion 5 mg/mL (Cath Lab,ACS,PCI indication)  0.25 mg/kg/hr  Intravenous  To Cath   .  DISCONTD: enoxaparin  40 mg  Subcutaneous  Q24H   Continuous:  .  nitroGLYCERIN  10 mcg/min (12/02/11 0645)   WUJ:WJXBJY chloride, acetaminophen, acetaminophen, ALPRAZolam, diazepam, morphine, nitroGLYCERIN, ondansetron (ZOFRAN) IV, ondansetron (ZOFRAN) IV, sodium chloride, zolpidem, DISCONTD: nitroGLYCERIN  Results for orders placed during the hospital encounter of 12/01/11 (from the past 48 hour(s))   CBC Status: Abnormal    Collection Time    12/01/11 3:58 PM   Component  Value  Range  Comment    WBC   14.6 (*)  4.0 - 10.5 (K/uL)     RBC  5.35  4.22 - 5.81 (MIL/uL)     Hemoglobin  17.3 (*)  13.0 - 17.0 (g/dL)     HCT  78.2  95.6 - 52.0 (%)     MCV  92.3  78.0 - 100.0 (fL)     MCH  32.3  26.0 - 34.0 (pg)     MCHC  35.0  30.0 - 36.0 (g/dL)     RDW  21.3  08.6 - 15.5 (%)     Platelets  247  150 - 400 (K/uL)    BASIC METABOLIC PANEL Status: Abnormal    Collection Time    12/01/11 3:58 PM   Component  Value  Range  Comment    Sodium  141  135 - 145 (mEq/L)     Potassium  4.2  3.5 - 5.1 (mEq/L)     Chloride  99  96 - 112 (mEq/L)     CO2  30  19 - 32 (mEq/L)     Glucose, Bld  175 (*)  70 - 99 (mg/dL)     BUN  27 (*)  6 - 23 (mg/dL)     Creatinine, Ser  5.78  0.50 - 1.35 (mg/dL)     Calcium  46.9  8.4 - 10.5 (mg/dL)     GFR calc non Af Amer  72 (*)  >90 (mL/min)     GFR calc Af Amer  83 (*)  >90 (mL/min)    PROTIME-INR Status: Normal    Collection Time    12/01/11 3:58 PM   Component  Value  Range  Comment    Prothrombin Time  13.1  11.6 - 15.2 (seconds)     INR  0.97  0.00 - 1.49    APTT Status: Normal    Collection Time    12/01/11 3:58 PM   Component  Value  Range  Comment    aPTT  27  24 -  37 (seconds)    POCT I-STAT TROPONIN I Status: Normal    Collection Time    12/01/11 4:35 PM   Component  Value  Range  Comment    Troponin i, poc  0.00  0.00 - 0.08 (ng/mL)     Comment 3      GLUCOSE, CAPILLARY Status: Abnormal    Collection Time    12/01/11 7:34 PM   Component  Value  Range  Comment    Glucose-Capillary  118 (*)  70 - 99 (mg/dL)    MRSA PCR SCREENING Status: Normal    Collection Time    12/01/11 7:38 PM   Component  Value  Range  Comment    MRSA by PCR  NEGATIVE  NEGATIVE    CARDIAC PANEL(CRET KIN+CKTOT+MB+TROPI) Status: Abnormal    Collection Time    12/01/11 8:15 PM   Component  Value  Range  Comment    Total CK  164  7 - 232 (U/L)     CK, MB  8.4 (*)  0.3 - 4.0 (ng/mL)     Troponin I  1.02 (*)  <0.30 (ng/mL)     Relative Index  5.1 (*)  0.0 - 2.5    CBC Status:  Abnormal    Collection Time    12/01/11 8:15 PM   Component  Value  Range  Comment    WBC  14.3 (*)  4.0 - 10.5 (K/uL)     RBC  4.96  4.22 - 5.81 (MIL/uL)     Hemoglobin  15.8  13.0 - 17.0 (g/dL)     HCT  16.1  09.6 - 52.0 (%)     MCV  92.5  78.0 - 100.0 (fL)     MCH  31.9  26.0 - 34.0 (pg)     MCHC  34.4  30.0 - 36.0 (g/dL)     RDW  04.5  40.9 - 15.5 (%)     Platelets  211  150 - 400 (K/uL)    CREATININE, SERUM Status: Abnormal    Collection Time    12/01/11 8:15 PM   Component  Value  Range  Comment    Creatinine, Ser  0.88  0.50 - 1.35 (mg/dL)     GFR calc non Af Amer  88 (*)  >90 (mL/min)     GFR calc Af Amer  >90  >90 (mL/min)    GLUCOSE, CAPILLARY Status: Abnormal    Collection Time    12/02/11 12:21 AM   Component  Value  Range  Comment    Glucose-Capillary  147 (*)  70 - 99 (mg/dL)    CARDIAC PANEL(CRET KIN+CKTOT+MB+TROPI) Status: Abnormal    Collection Time    12/02/11 1:20 AM   Component  Value  Range  Comment    Total CK  296 (*)  7 - 232 (U/L)     CK, MB  24.8 (*)  0.3 - 4.0 (ng/mL)  CRITICAL VALUE NOTED. VALUE IS CONSISTENT WITH PREVIOUSLY REPORTED AND CALLED VALUE.    Troponin I  2.82 (*)  <0.30 (ng/mL)     Relative Index  8.4 (*)  0.0 - 2.5    GLUCOSE, CAPILLARY Status: Abnormal    Collection Time    12/02/11 7:38 AM   Component  Value  Range  Comment    Glucose-Capillary  109 (*)  70 - 99 (mg/dL)    CARDIAC PANEL(CRET KIN+CKTOT+MB+TROPI) Status: Abnormal    Collection Time    12/02/11 8:06 AM  Component  Value  Range  Comment    Total CK  567 (*)  7 - 232 (U/L)     CK, MB  52.8 (*)  0.3 - 4.0 (ng/mL)  CRITICAL VALUE NOTED. VALUE IS CONSISTENT WITH PREVIOUSLY REPORTED AND CALLED VALUE.    Troponin I  7.30 (*)  <0.30 (ng/mL)     Relative Index  9.3 (*)  0.0 - 2.5    COMPREHENSIVE METABOLIC PANEL Status: Abnormal    Collection Time    12/02/11 8:06 AM   Component  Value  Range  Comment    Sodium  139  135 - 145 (mEq/L)     Potassium  4.4  3.5 - 5.1 (mEq/L)       Chloride  101  96 - 112 (mEq/L)     CO2  30  19 - 32 (mEq/L)     Glucose, Bld  122 (*)  70 - 99 (mg/dL)     BUN  19  6 - 23 (mg/dL)     Creatinine, Ser  4.54  0.50 - 1.35 (mg/dL)     Calcium  9.5  8.4 - 10.5 (mg/dL)     Total Protein  6.1  6.0 - 8.3 (g/dL)     Albumin  3.5  3.5 - 5.2 (g/dL)     AST  47 (*)  0 - 37 (U/L)     ALT  23  0 - 53 (U/L)     Alkaline Phosphatase  49  39 - 117 (U/L)     Total Bilirubin  0.7  0.3 - 1.2 (mg/dL)     GFR calc non Af Amer  >90  >90 (mL/min)     GFR calc Af Amer  >90  >90 (mL/min)    HEMOGLOBIN A1C Status: Abnormal    Collection Time    12/02/11 8:06 AM   Component  Value  Range  Comment    Hemoglobin A1C  6.2 (*)  <5.7 (%)     Mean Plasma Glucose  131 (*)  <117 (mg/dL)    CBC Status: Abnormal    Collection Time    12/02/11 8:06 AM   Component  Value  Range  Comment    WBC  15.8 (*)  4.0 - 10.5 (K/uL)     RBC  4.54  4.22 - 5.81 (MIL/uL)     Hemoglobin  14.4  13.0 - 17.0 (g/dL)     HCT  09.8  11.9 - 52.0 (%)     MCV  92.1  78.0 - 100.0 (fL)     MCH  31.7  26.0 - 34.0 (pg)     MCHC  34.4  30.0 - 36.0 (g/dL)     RDW  14.7  82.9 - 15.5 (%)     Platelets  196  150 - 400 (K/uL)    LIPID PANEL Status: Abnormal    Collection Time    12/02/11 8:06 AM   Component  Value  Range  Comment    Cholesterol  181  0 - 200 (mg/dL)     Triglycerides  562  <150 (mg/dL)     HDL  36 (*)  >13 (mg/dL)     Total CHOL/HDL Ratio  5.0      VLDL  27  0 - 40 (mg/dL)     LDL Cholesterol  086 (*)  0 - 99 (mg/dL)    GLUCOSE, CAPILLARY Status: Abnormal    Collection Time    12/02/11 11:05 AM  Component  Value  Range  Comment    Glucose-Capillary  122 (*)  70 - 99 (mg/dL)    GLUCOSE, CAPILLARY Status: Abnormal    Collection Time    12/02/11 4:05 PM   Component  Value  Range  Comment    Glucose-Capillary  180 (*)  70 - 99 (mg/dL)    No results found.  Review of Systems  Constitutional: Negative.  HENT: Negative.  Eyes: Negative.  Respiratory: Positive for  shortness of breath. Negative for cough, hemoptysis and sputum production.  Cardiovascular: Positive for chest pain. Negative for palpitations, orthopnea, leg swelling and PND.  Gastrointestinal: Negative.  Genitourinary: Negative.  Musculoskeletal: Negative.  Skin: Negative.  Neurological: Negative.  Endo/Heme/Allergies: Negative.  Psychiatric/Behavioral: Negative.  Blood pressure 155/95, pulse 82, temperature 98.8 F (37.1 C), temperature source Oral, resp. rate 20, height 6' (1.829 m), weight 122 kg (268 lb 15.4 oz), SpO2 95.00%.  Physical Exam  Constitutional: He is oriented to person, place, and time. He appears well-developed and well-nourished. No distress.  HENT:  Head: Normocephalic and atraumatic.  Mouth/Throat: Oropharynx is clear and moist.  Eyes: EOM are normal. Pupils are equal, round, and reactive to light.  Neck: No JVD present. No thyromegaly present.  Cardiovascular: Normal rate, regular rhythm, normal heart sounds and intact distal pulses. Exam reveals no gallop and no friction rub.  No murmur heard.  Respiratory: Effort normal. No respiratory distress. He has no wheezes. He has no rales.  GI: Soft. Bowel sounds are normal. He exhibits no mass. There is no tenderness.  Musculoskeletal: He exhibits no edema.  Lymphadenopathy:  He has no cervical adenopathy.  Neurological: He is alert and oriented to person, place, and time. He has normal strength. No cranial nerve deficit or sensory deficit.  Skin: Skin is warm and dry.  Psychiatric: He has a normal mood and affect.  Cardiac Cath:  Diagnostic Procedure Details: The right groin was prepped, draped, and anesthetized with 1% lidocaine. Using the modified Seldinger technique, a 6 French sheath was introduced into the right femoral artery. Standard Judkins catheters were used for selective coronary angiography. Catheter exchanges were performed over a wire. The diagnostic procedure was well-tolerated without immediate  complications. The RCA appeared to be a small caliber vessel, co dominant with the circumflex. There was TIMI 1 flow and 2 high grade lesion. We used a JR 4 with SH to cannulate, and bivalirudin was given per protocol. Clopidogrel was also given. The lesions were crossed with a traverse wire and then balloon dilatation done with a 2.0 balloon without much improvement. We were able to reestablish flow but it recurrently deteriorated. We used an over the wire balloon to get down, and a LUGE wire was placed along with a buddy wire. Despite this, we were unable to pass a 2.0 mm stent distally. We continued to do balloon dilatations, and while there was some flow, the areas would reclose. We were going to attempt to pass a guideliner down the the vessel, but we abandoned that without attempt due to the vessel size. We did a final dilatation, and at the end there was flow in a severly diseased vessel, but likely at high risk for closure. However, we were approaching 40 minutes of fluoro time, and given the small distribution of the distal vessel, it was felt that further attempts should be abandoned as the patient will likely need CABG for his residual disease. We showed the patient images throughout the course of the procedure. He remained  stable, the sheath was sewn in to place, and he was taken to the CCU for sheath removal.  PROCEDURAL FINDINGS  Hemodynamics:  AO 172/111(140)  LV not done  Coronary angiography:  Coronary dominance: left  Left mainstem: Heavily calcified. Not critically narrowed.  Left anterior descending (LAD): Courses to the apex. Also calcified. There is 70% narrowing in the LAD at the diagonal, and the diagonal has about 90% narrowing. There is an 80-90% mid lesion with larger distal vessel, and the apical vessel appears diffusely narrow. Both the diagonal and LAD appear graftable.  Left circumflex (LCx): There is a large ramus vessel that has ostial narrowing of 80% at its takeoff from  the trifurcation. The OM 1 has 80% proximal narrowing, and is a large vessel distally. There also appears to be a 70% or more stenosis leading into the PDA (co dominant)  Right coronary artery (RCA): Non dominant vessel with a small codom PDA distally. Shephard's crook takeoff proximally followed by a 95 % lesion at the prox mid junction, then a subtotal distal lesion with TIMI 1 flow. At the completion of the procedure there was TIMI 3 flow with spasm throughout the small caliber vessel. The lesions themselves were open with the proximal 50% with significant intimal disruption, and the distal with diffuse spasm distally, and at least 50-70% narrowing. The potential for abrupt closure (small vessel) was thought high, but the vessel was not amenable to either stenting or bypass. The amount of myocardium supplied was small.  Left ventriculography: Left ventricular systolic function is normal, LVEF is estimated at 55-65%, there is no significant mitral regurgitation  Final Conclusions:  1. Inferior MI with anterior changes due to non dominant RCA with tandem lesions.  2. PTCA only with inability to pass stents, and suboptimal final result but small distal vessel.  3. Severe disease of the LAD and LCX that will require elective revascularizationi.  Assessment/Plan:  He has severe multivessel coronary occlusive disease, status post inferior ST elevation MI. I agree that coronary bypass graft surgery is his best treatment to prevent further ischemia or infarction. I discussed the operative procedure with him and his family including alternatives, benefits, and risks. He would like to proceed with surgery.

## 2011-12-13 NOTE — Interval H&P Note (Signed)
History and Physical Interval Note:  12/13/2011 8:12 AM  Daniel [name]  has presented today for surgery, with the diagnosis of CAD  The various methods of treatment have been discussed with the patient and family. After consideration of risks, benefits and other options for treatment, the patient has consented to  Procedure(s) (LRB): CORONARY ARTERY BYPASS GRAFTING (CABG) (N/A) as a surgical intervention .  The patients' history has been reviewed, patient examined, no change in status, stable for surgery.  I have reviewed the patients' chart and labs.  Questions were answered to the patient's satisfaction.     Alleen Borne

## 2011-12-13 NOTE — Transfer of Care (Signed)
Immediate Anesthesia Transfer of Care Note  Patient: Daniel [name]  Procedure(s) Performed: Procedure(s) (LRB): CORONARY ARTERY BYPASS GRAFTING (CABG) (N/A)  Patient Location: PACU  Anesthesia Type: General  Level of Consciousness: sedated  Airway & Oxygen Therapy: Patient connected to T-piece oxygen  Post-op Assessment: Report given to PACU RN and Post -op Vital signs reviewed and stable  Post vital signs: Reviewed and stable  Complications: No apparent anesthesia complications

## 2011-12-13 NOTE — Progress Notes (Signed)
Patient ID: Daniel [name], male   DOB: [birth year], 66 y.o.   MRN: [id number] S/p CABG  Intubated, starting to wake up  BP 90/68  Pulse 89  Temp(Src) 98.2 F (36.8 C) (Core (Comment))  Resp 20  Ht 5\' 11"  (1.803 m)  Wt 257 lb 15 oz (117 kg)  BMI 35.98 kg/m2  SpO2 100%  Intake/Output Summary (Last 24 hours) at 12/13/11 1828 Last data filed at 12/13/11 1800  Gross per 24 hour  Intake 4558.18 ml  Output   2965 ml  Net 1593.18 ml   Ct minimal output Labs OK Hopefully can extubate soon

## 2011-12-13 NOTE — Discharge Summary (Signed)
Patient will return for elective CABG, and be seen in the office prior to this.  He is amenable.  TS

## 2011-12-13 NOTE — Brief Op Note (Signed)
12/13/2011  12:01 PM  PATIENT:  Daniel [name]  66 y.o. male  PRE-OPERATIVE DIAGNOSIS:  Coronary artery disease  POST-OPERATIVE DIAGNOSIS:  Coronary artery disease  PROCEDURE:  Procedure(s): CORONARY ARTERY BYPASS GRAFTING (CABG) x 5 (LIMA-LAD, SVG-D, SVG-Int-OM, SVG-RCA), EVH R leg  SURGEON:  Surgeon(s): Alleen Borne, MD  ASSISTANT: Coral Ceo, PA-C  ANESTHESIA:   general  PATIENT CONDITION:  ICU - intubated and hemodynamically stable.  PRE-OPERATIVE WEIGHT: 117 kg

## 2011-12-13 NOTE — ED Provider Notes (Signed)
Medical screening examination/treatment/procedure(s) were conducted as a shared visit with non-physician practitioner(s) and myself.  I personally evaluated the patient during the encounter  Please see my separate respective documentation pertaining to this patient encounter   Vida Roller, MD 12/13/11 [phone]

## 2011-12-13 NOTE — Anesthesia Preprocedure Evaluation (Addendum)
Anesthesia Evaluation  Patient identified by MRN, date of birth, ID band Patient awake, Patient confused and Patient unresponsive    Reviewed: Allergy & Precautions, H&P , NPO status , Patient's Chart, lab work & pertinent test results, reviewed documented beta blocker date and time   Airway Mallampati: II TM Distance: >3 FB Neck ROM: Full    Dental  (+) Poor Dentition and Dental Advisory Given   Pulmonary former smoker Quit 2 wks ago         Cardiovascular hypertension, Pt. on medications and Pt. on home beta blockers + CAD and + Past MI  MI 12/01/11   Neuro/Psych negative neurological ROS  negative psych ROS   GI/Hepatic Neg liver ROS, Pt denies having GERD   Endo/Other  Diabetes mellitus-, Well Controlled, Type 1DX 6 mo ago  Renal/GU negative Renal ROS  negative genitourinary   Musculoskeletal negative musculoskeletal ROS (+)   Abdominal   Peds negative pediatric ROS (+)  Hematology negative hematology ROS (+)   Anesthesia Other Findings   Reproductive/Obstetrics negative OB ROS                          Anesthesia Physical Anesthesia Plan  ASA: III  Anesthesia Plan: General   Post-op Pain Management:    Induction: Intravenous  Airway Management Planned: Oral ETT  Additional Equipment: Arterial line and PA Cath  Intra-op Plan:   Post-operative Plan: Post-operative intubation/ventilation  Informed Consent: I have reviewed the patients History and Physical, chart, labs and discussed the procedure including the risks, benefits and alternatives for the proposed anesthesia with the patient or authorized representative who has indicated his/her understanding and acceptance.     Plan Discussed with:   Anesthesia Plan Comments:        Anesthesia Quick Evaluation

## 2011-12-14 ENCOUNTER — Inpatient Hospital Stay (HOSPITAL_COMMUNITY): Payer: Medicare Other

## 2011-12-14 ENCOUNTER — Encounter (HOSPITAL_COMMUNITY): Payer: Self-pay | Admitting: Surgery

## 2011-12-14 LAB — GLUCOSE, CAPILLARY
Glucose-Capillary: 112 mg/dL — ABNORMAL HIGH (ref 70–99)
Glucose-Capillary: 107 mg/dL — ABNORMAL HIGH (ref 70–99)

## 2011-12-14 LAB — BASIC METABOLIC PANEL
Chloride: 108 mEq/L (ref 96–112)
GFR calc Af Amer: >90 mL/min (ref >90)
Potassium: 4.4 mEq/L (ref 3.5–5.1)

## 2011-12-14 LAB — CBC
HCT: 28.2 % — ABNORMAL LOW (ref 39.0–52.0)
Platelets: 143 10*3/uL — ABNORMAL LOW (ref 150–400)
RDW: 12.7 % (ref 11.5–15.5)
WBC: 11.4 10*3/uL — ABNORMAL HIGH (ref 4.0–10.5)

## 2011-12-14 LAB — MAGNESIUM: Magnesium: 2.1 mg/dL (ref 1.5–2.5)

## 2011-12-14 MED ORDER — BISACODYL 5 MG PO TBEC
10.0000 mg | DELAYED_RELEASE_TABLET | Freq: Every day | ORAL | Status: DC | PRN
  Administered 2011-12-15: 10 mg via ORAL
  Filled 2011-12-14: qty 2

## 2011-12-14 MED ORDER — TRAMADOL HCL 50 MG PO TABS
50.0000 mg | ORAL_TABLET | ORAL | Status: DC | PRN
  Administered 2011-12-14 – 2011-12-15 (×4): 100 mg via ORAL
  Filled 2011-12-14 (×4): qty 2

## 2011-12-14 MED ORDER — SODIUM CHLORIDE 0.9 % IV SOLN: 250.0000 mL | INTRAVENOUS | Status: DC | PRN

## 2011-12-14 MED ORDER — SODIUM CHLORIDE 0.9 % IJ SOLN
3.0000 mL | INTRAMUSCULAR | Status: DC | PRN
  Administered 2011-12-15: 3 mL via INTRAVENOUS

## 2011-12-14 MED ORDER — ONDANSETRON HCL 4 MG/2ML IJ SOLN: 4.0000 mg | Freq: Four times a day (QID) | INTRAMUSCULAR | Status: DC | PRN

## 2011-12-14 MED ORDER — SODIUM CHLORIDE 0.9 % IJ SOLN
3.0000 mL | Freq: Two times a day (BID) | INTRAMUSCULAR | Status: DC
  Administered 2011-12-14 – 2011-12-17 (×7): 3 mL via INTRAVENOUS

## 2011-12-14 MED ORDER — MOVING RIGHT ALONG BOOK
Freq: Once | Status: AC
  Administered 2011-12-15
  Filled 2011-12-14: qty 1

## 2011-12-14 MED ORDER — KETOROLAC TROMETHAMINE 15 MG/ML IJ SOLN
15.0000 mg | Freq: Four times a day (QID) | INTRAMUSCULAR | Status: DC | PRN
  Administered 2011-12-14 – 2011-12-15 (×3): 15 mg via INTRAVENOUS
  Filled 2011-12-14 (×4): qty 1

## 2011-12-14 MED ORDER — ONDANSETRON HCL 4 MG PO TABS: 4.0000 mg | ORAL_TABLET | Freq: Four times a day (QID) | ORAL | Status: DC | PRN

## 2011-12-14 MED ORDER — BISACODYL 10 MG RE SUPP: 10.0000 mg | Freq: Every day | RECTAL | Status: DC | PRN

## 2011-12-14 MED ORDER — ACETAMINOPHEN 325 MG PO TABS
650.0000 mg | ORAL_TABLET | Freq: Four times a day (QID) | ORAL | Status: DC | PRN
  Administered 2011-12-14: 650 mg via ORAL
  Filled 2011-12-14: qty 2

## 2011-12-14 MED ORDER — FUROSEMIDE 40 MG PO TABS
40.0000 mg | ORAL_TABLET | Freq: Every day | ORAL | Status: AC
  Administered 2011-12-15 – 2011-12-17 (×3): 40 mg via ORAL
  Filled 2011-12-14 (×3): qty 1

## 2011-12-14 MED ORDER — PANTOPRAZOLE SODIUM 40 MG PO TBEC
40.0000 mg | DELAYED_RELEASE_TABLET | Freq: Every day | ORAL | Status: DC
  Administered 2011-12-15 – 2011-12-18 (×4): 40 mg via ORAL
  Filled 2011-12-14 (×3): qty 1

## 2011-12-14 MED ORDER — DOCUSATE SODIUM 100 MG PO CAPS
200.0000 mg | ORAL_CAPSULE | Freq: Every day | ORAL | Status: DC
  Administered 2011-12-15 – 2011-12-18 (×4): 200 mg via ORAL
  Filled 2011-12-14 (×4): qty 2

## 2011-12-14 MED ORDER — FUROSEMIDE 10 MG/ML IJ SOLN
40.0000 mg | Freq: Once | INTRAMUSCULAR | Status: AC
  Administered 2011-12-14: 40 mg via INTRAVENOUS
  Filled 2011-12-14: qty 4

## 2011-12-14 MED ORDER — ASPIRIN EC 325 MG PO TBEC
325.0000 mg | DELAYED_RELEASE_TABLET | Freq: Every day | ORAL | Status: DC
  Administered 2011-12-15 – 2011-12-18 (×4): 325 mg via ORAL
  Filled 2011-12-14 (×5): qty 1

## 2011-12-14 MED ORDER — INSULIN ASPART 100 UNIT/ML ~~LOC~~ SOLN
0.0000 [IU] | Freq: Three times a day (TID) | SUBCUTANEOUS | Status: DC
  Administered 2011-12-14 – 2011-12-18 (×6): 2 [IU] via SUBCUTANEOUS

## 2011-12-14 MED ORDER — POTASSIUM CHLORIDE CRYS ER 20 MEQ PO TBCR
40.0000 meq | EXTENDED_RELEASE_TABLET | Freq: Every day | ORAL | Status: AC
  Administered 2011-12-15 – 2011-12-17 (×3): 40 meq via ORAL
  Filled 2011-12-14 (×3): qty 2

## 2011-12-14 MED ORDER — CARVEDILOL 6.25 MG PO TABS
6.2500 mg | ORAL_TABLET | Freq: Two times a day (BID) | ORAL | Status: DC
  Administered 2011-12-14 – 2011-12-18 (×7): 6.25 mg via ORAL
  Filled 2011-12-14 (×10): qty 1

## 2011-12-14 NOTE — Procedures (Signed)
Extubation Procedure Note  Patient Details:   Name: Daniel [name] DOB: 12-24-1945 MRN: [id number]   Airway Documentation:     Evaluation  O2 sats: stable throughout Complications: No apparent complications Patient did tolerate procedure well. Bilateral Breath Sounds: Clear;Diminished Suctioning: Oral Yes  [name] 12/14/2011, 8:41 AM

## 2011-12-14 NOTE — Progress Notes (Signed)
UR Completed.  Aarin Sparkman Jane [phone] 12/14/2011  

## 2011-12-14 NOTE — Progress Notes (Signed)
1 Day Post-Op Procedure(s) (LRB): CORONARY ARTERY BYPASS GRAFTING (CABG) (N/A) Subjective: No complaints  Objective: Vital signs in last 24 hours: Temp:  [95.5 F (35.3 C)-98.6 F (37 C)] 98.4 F (36.9 C) (04/11 0800) Pulse Rate:  [73-91] 83  (04/11 0800) Cardiac Rhythm:  [-] Normal sinus rhythm (04/11 0800) Resp:  [12-29] 17  (04/11 0800) BP: (80-118)/(53-86) 94/57 mmHg (04/11 0800) SpO2:  [96 %-100 %] 97 % (04/11 0800) Arterial Line BP: (79-150)/(54-87) 98/59 mmHg (04/11 0800) FiO2 (%):  [40 %-50 %] 40 % (04/10 1801) Weight:  [117 kg (257 lb 15 oz)-122.7 kg (270 lb 8.1 oz)] 122.7 kg (270 lb 8.1 oz) (04/11 0500)  Hemodynamic parameters for last 24 hours: PAP: (19-35)/(11-24) 33/20 mmHg CO:  [4.5 L/min-7 L/min] 7 L/min CI:  [1.9 L/min/m2-3.2 L/min/m2] 3 L/min/m2  Intake/Output from previous day: 04/10 0701 - 04/11 0700 In: 6024.7 [P.O.:380; I.V.:3555.7; Blood:539; IV Piggyback:1550] Out: 4120 [Urine:2750; Blood:800; Chest Tube:570] Intake/Output this shift: Total I/O In: 100 [P.O.:60; I.V.:40] Out: -   General appearance: alert and cooperative Neurologic: intact Heart: regular rate and rhythm, S1, S2 normal, no murmur, click, rub or gallop Lungs: clear to auscultation bilaterally Extremities: edema mild Wound: dressings dry  Lab Results:  Basename 12/14/11 0400 12/13/11 2107 12/13/11 2100  WBC 11.4* -- 14.3*  HGB 9.5* 9.2* --  HCT 28.2* 27.0* --  PLT 143* -- 147*   BMET:  Basename 12/14/11 0400 12/13/11 2107 12/11/11 1512  NA 138 142 --  K 4.4 4.2 --  CL 108 108 --  CO2 23 -- 20  GLUCOSE 112* 138* --  BUN 18 20 --  CREATININE 0.65 0.70 --  CALCIUM 7.7* -- 9.2    PT/INR:  Basename 12/13/11 1530  LABPROT 18.7*  INR 1.53*   ABG    Component Value Date/Time   PHART 7.383 12/13/2011 1941   HCO3 23.5 12/13/2011 1941   TCO2 22 12/13/2011 2107   ACIDBASEDEF 1.0 12/13/2011 1941   O2SAT 97.0 12/13/2011 1941   CBG (last 3)   Basename 12/14/11 0400 12/14/11  0006 12/13/11 2244  GLUCAP 112* 123* 112*   CXR:  Clear  ECG:  NSR 81. IMI , no acute ischemic changes.  Assessment/Plan: S/P Procedure(s) (LRB): CORONARY ARTERY BYPASS GRAFTING (CABG) (N/A) Mobilize Diuresis Diabetes control d/c tubes/lines Plan for transfer to step-down: see transfer orders No ACE inhibitors due to angioedema   LOS: 1 day    Daniel [name] K 12/14/2011

## 2011-12-14 NOTE — Op Note (Signed)
Daniel [name], CAI NO.:  [id number]  MEDICAL RECORD NO.:  [id number]  LOCATION:  2305                         FACILITY:  [facility]  PHYSICIAN:  Daniel [name], M.D.     DATE OF BIRTH:  06-10-1946  DATE OF PROCEDURE:  12/13/2011 DATE OF DISCHARGE:                              OPERATIVE REPORT   PREOPERATIVE DIAGNOSIS:  Severe multivessel coronary artery disease.  POSTOPERATIVE DIAGNOSIS:  Severe multivessel coronary artery disease.  OPERATIVE PROCEDURE:  Median sternotomy, extracorporeal circulation, coronary artery bypass graft surgery x5 using a left internal mammary artery graft to the left anterior descending coronary artery, with a saphenous vein graft to the diagonal branch of the LAD, a sequential saphenous vein graft to the intermediate and obtuse marginal branches of the left circumflex coronary artery, and the saphenous vein graft to the right coronary artery.  Endoscopic vein harvesting from the right leg.  ATTENDING SURGEON:  Daniel Croon, MD  ASSISTANT:  Coral Ceo, PA-C  ANESTHESIA:  General endotracheal.  CLINICAL HISTORY:  This patient is a 66 year old gentleman with a significant family history of heart disease and multiple cardiac risk factors including obesity, cigar smoking, hypertension, and diabetes who developed severe substernal chest discomfort and shortness of breath while watching TV.  He presented with an acute inferior ST-segment elevation MI and was taken to Cath Lab as a code STEMI by Dr. Riley Kill. The culprit was found to be a subtotally occluded right coronary artery, which appeared to be a relatively small vessel.  Attempts to open the artery were unsuccessful and given the fact that the patient also had significant multivessel coronary artery disease and the right coronary artery was only supplying a small territory.  It was felt that the procedure should be concluded and bypass surgery planned.  He was given a loading dose  of Plavix and therefore, surgery had to be delayed at least 5 days.  The patient was seen by me in the hospital and left on vacation, he was discharged home to return today for surgery.  I did discuss the operative procedure with the patient including alternatives, benefits, and risks including, but not limited to bleeding, blood transfusion, infection, stroke, myocardial infarction, graft failure, and death.  Also discussed the importance of maximum cardiac risk factor reduction including complete smoking cessation.  He understood and agreed to proceed.  OPERATIVE PROCEDURE:  The patient was taken to the operating room and placed on the table in supine position.  After induction of general endotracheal anesthesia, a Foley catheter was placed in the bladder using sterile technique.  Then, the chest, abdomen, and both lower extremities were prepped and draped in usual sterile manner.  The chest was entered through a median sternotomy incision.  The pericardium opened the midline.  Examination of the heart showed good ventricular contractility.  The ascending aorta was of normal size and had no palpable plaques in it.  Then, the left internal mammary artery was harvested from the chest wall as a pedicle graft.  This was a medium caliber vessel with excellent blood flow through it.  At the same time, the segment of greater saphenous vein was harvested from the right leg using endoscopic vein harvest technique.  This vein  was of medium size and good quality.  The patient was then heparinized and when an adequate ACT was obtained, the distal ascending aorta was cannulated using a 22-French aortic cannula for arterial inflow.  Venous outflow was achieved using a two- stage venous cannula for the right atrial appendage.  An antegrade cardioplegia and vent cannula was inserted in the aortic root.  The patient was placed on cardiopulmonary bypass and distal coronary arteries were identified.   The LAD was a large graftable vessel that was heavily diseased throughout its proximal and midportion, but distally, there was no significant disease.  The diagonal branch was also diffusely diseased throughout its proximal portion, but beyond that, there was no significant disease.  The intermediate coronary artery was dismal proximally, but it was a large graftable vessel and then became intramyocardial.  The obtuse marginal was heavily diseased in its proximal portion, but more distally was suitable for grafting.  The distal left circumflex terminated as a small posterior descending branch.  I did not see any significant stenosis in the left circumflex compromising the small posterior descending branch.  The right coronary artery was really a nondominant vessel.  The terminal branch of the right coronary artery crossed the acute margin down to the inferior wall.  This vessel was small, but felt to be borderline for grafting and I decided to graft it.  Then, the aorta was crossclamped and 1000 mL of cold blood antegrade cardioplegia was administered in the aortic root with quick arrest of the heart.  Systemic hypothermia to 32 degrees centigrade and topical hypothermia with iced saline was used.  A temperature probe was placed in the septum and insulating pad in the pericardium.  The first distal anastomosis was performed to the intermediate coronary artery.  The internal diameter of this vessel was about 1.75 mm.  The conduit used was a segment of greater saphenous vein and the anastomosis was performed in a sequential side-to-side manner using continuous 7-0 Prolene suture.  Flow was noted through the graft and was excellent.  The second distal anastomosis was performed to the obtuse marginal branch.  The internal diameter of this vessel was also about 1.75 mm. The conduit used was the same segment of greater saphenous vein and the anastomosis was performed in a sequential  end-to-side manner using continuous 7-0 Prolene suture.  Flow was noted through the graft was excellent.  Then, another dose of cardioplegia was given down the vein graft and in the aortic root.  The third distal anastomosis was performed on the diagonal branch.  The internal diameter of this vessel was about 1.6 mm.  Conduit used was a second segment of greater saphenous vein and the anastomosis was performed in an end-to-side manner using continuous 7-0 Prolene suture. Flow was noted through the graft and was excellent.  The fourth distal anastomosis was performed to the distal right coronary artery.  The internal diameter of this vessel was about 1.5 mm.  Conduit used was a third segment of greater saphenous vein and the anastomosis was performed in an end-to-side manner using continuous 7-0 Prolene suture.  Flow was noted through the graft and was excellent.  Then, another dose of cardioplegia was given in the aortic root and down the vein grafts.  The fifth distal anastomosis was performed to the distal LAD.  The internal diameter distally was about 1.75 mm.  The conduit used was the left internal mammary artery graft, this was brought through an opening in the left pericardium  anterior to the phrenic nerve.  It was anastomosed to the LAD in an end-to-side manner using continuous 8-0 Prolene suture.  The pedicle was sutured to the epicardium with 6-0 Prolene sutures.  Then, the patient was rewarmed to 37 degrees centigrade.  Another dose of cardioplegia was given and the three proximal vein graft anastomoses were performed to the mid ascending aorta in an end-to-side manner using continuous 6-0 Prolene suture. Then, the clamp was removed from the mammary artery pedicle.  There was rapid warming of the ventricular septum and return of spontaneous ventricular fibrillation.  The crossclamp time was 95 minutes.  With crossclamp removed, the patient was spontaneously converted to  sinus rhythm.  The proximal and distal anastomoses were appeared hemostatic and allowed the grafts satisfactory.  Graft markers were placed around the proximal anastomoses.  Two temporary right ventricular and right atrial pacing wires were placed and brought out through the skin.  When the patient was rewarmed to 37 degrees centigrade, he was weaned from cardiopulmonary bypass on no inotropic agents.  Total bypass time was 123 minutes.  Cardiac function appeared excellent with the cardiac output of 8 liters per minute.  Protamine was given and the venous and aortic cannulas were removed without difficulty.  Hemostasis was achieved.  Three chest tubes were placed with tube in the posterior pericardium, one in the left pleural space and one in the anterior mediastinum.  The sternum was then closed with double #6 stainless steel wires.  The fascia was closed with continuous #1 Vicryl suture. Subcutaneous tissue was closed with continuous 2-0 Vicryl and the skin with a 3-0 Vicryl subcuticular closure.  The lower extremity vein harvest site was closed in layers in a similar manner.  The sponge, needle, and instrument counts were correct according to the scrub nurse. Dry sterile dressing was applied over the incision and around the chest tubes, which were hooked to Pleur-Evac suction.  The patient remained hemodynamically stable and was transferred to the SICU in guarded, but stable condition.     Daniel [name], M.D.     BB/MEDQ  D:  12/13/2011  T:  12/14/2011  Job:  [id number]

## 2011-12-14 NOTE — Progress Notes (Signed)
   CARE MANAGEMENT NOTE 12/14/2011  Patient:  Daniel [name], Daniel [name]   Account Number:  [id number]  Date Initiated:  12/14/2011  Documentation initiated by:  [facility]  Subjective/Objective Assessment:   CABG x5     Action/Plan:   PTA, PT INDEPENDENT; STATES HIS FRIEND WENDY WILL PROVIDE 24HR CARE AT DISCHARGE.  HE STATES HIS DAUGHTERS WILL ASSIST AS WELL.  WILL FOLLOW FOR HOME NEEDS AS PT PROGRESSES.   Anticipated DC Date:  12/19/2011   Anticipated DC Plan:  HOME W HOME HEALTH SERVICES      DC Planning Services  CM consult      Choice offered to / List presented to:             Status of service:  In process, will continue to follow Medicare Important Message given?   (If response is "NO", the following Medicare IM given date fields will be blank) Date Medicare IM given:   Date Additional Medicare IM given:    Discharge Disposition:    Per UR Regulation:  Reviewed for med. necessity/level of care/duration of stay  If discussed at Long Length of Stay Meetings, dates discussed:    Comments:    Phone #[phone]

## 2011-12-14 NOTE — Anesthesia Postprocedure Evaluation (Signed)
  Anesthesia Post-op Note  Patient: Daniel [name]  Procedure(s) Performed: Procedure(s) (LRB): CORONARY ARTERY BYPASS GRAFTING (CABG) (N/A)  Patient Location: PACU and SICU  Anesthesia Type: General  Level of Consciousness: sedated  Airway and Oxygen Therapy: Patient Spontanous Breathing  Post-op Pain: mild  Post-op Assessment: Post-op Vital signs reviewed  Post-op Vital Signs: Reviewed  Complications: No apparent anesthesia complications

## 2011-12-14 NOTE — Progress Notes (Signed)
Patient ID: Daniel [name], male   DOB: May 19, 1946, 66 y.o.   MRN: [id number]  Filed Vitals:   12/14/11 1700 12/14/11 1800 12/14/11 1900 12/14/11 1949  BP:      Pulse: 87 95 81   Temp:    99.7 F (37.6 C)  TempSrc:    Oral  Resp: 22 27 24    Height:      Weight:      SpO2: 95% 97% 95%    Excellent diuresis today. Ambulated well.  Only complaint is pain from chest tube sites. Will give him some toradol since creat is normal.

## 2011-12-15 ENCOUNTER — Other Ambulatory Visit: Payer: Self-pay

## 2011-12-15 LAB — CBC
HCT: 31.1 % — ABNORMAL LOW (ref 39.0–52.0)
MCH: 31 pg (ref 26.0–34.0)
MCV: 92.6 fL (ref 78.0–100.0)
RDW: 12.6 % (ref 11.5–15.5)
WBC: 13.2 10*3/uL — ABNORMAL HIGH (ref 4.0–10.5)

## 2011-12-15 LAB — BASIC METABOLIC PANEL
BUN: 21 mg/dL (ref 6–23)
CO2: 29 mEq/L (ref 19–32)
Calcium: 8.5 mg/dL (ref 8.4–10.5)
Chloride: 100 mEq/L (ref 96–112)
Creatinine, Ser: 0.89 mg/dL (ref 0.50–1.35)

## 2011-12-15 LAB — GLUCOSE, CAPILLARY
Glucose-Capillary: 91 mg/dL (ref 70–99)
Glucose-Capillary: 128 mg/dL — ABNORMAL HIGH (ref 70–99)

## 2011-12-15 MED ORDER — TRAMADOL HCL 50 MG PO TABS: 50.0000 mg | ORAL_TABLET | Freq: Four times a day (QID) | ORAL | Status: AC | PRN

## 2011-12-15 MED ORDER — FUROSEMIDE 40 MG PO TABS: 40.0000 mg | ORAL_TABLET | Freq: Every day | ORAL | Status: DC

## 2011-12-15 MED ORDER — AMIODARONE HCL IN DEXTROSE 360-4.14 MG/200ML-% IV SOLN
INTRAVENOUS | Status: AC
  Filled 2011-12-15: qty 200

## 2011-12-15 MED ORDER — POTASSIUM CHLORIDE CRYS ER 20 MEQ PO TBCR: 20.0000 meq | EXTENDED_RELEASE_TABLET | Freq: Every day | ORAL | Status: DC

## 2011-12-15 MED ORDER — AMIODARONE HCL 200 MG PO TABS
400.0000 mg | ORAL_TABLET | Freq: Once | ORAL | Status: AC
  Administered 2011-12-16: 400 mg via ORAL
  Filled 2011-12-15: qty 2

## 2011-12-15 MED ORDER — AMIODARONE IV BOLUS ONLY 150 MG/100ML
150.0000 mg | INTRAVENOUS | Status: AC
  Administered 2011-12-15: 150 mg via INTRAVENOUS
  Filled 2011-12-15: qty 100

## 2011-12-15 MED ORDER — AMIODARONE IV BOLUS ONLY 150 MG/100ML
150.0000 mg | Freq: Once | INTRAVENOUS | Status: DC
Start: 2011-12-15 — End: 2011-12-15

## 2011-12-15 MED ORDER — OXYCODONE HCL 5 MG PO TABS
5.0000 mg | ORAL_TABLET | ORAL | Status: DC | PRN
  Administered 2011-12-15 (×2): 10 mg via ORAL
  Administered 2011-12-16: 5 mg via ORAL
  Administered 2011-12-16: 10 mg via ORAL
  Administered 2011-12-16: 5 mg via ORAL
  Administered 2011-12-16 – 2011-12-18 (×9): 10 mg via ORAL
  Filled 2011-12-15 (×5): qty 2
  Filled 2011-12-15 (×2): qty 1
  Filled 2011-12-15 (×2): qty 2
  Filled 2011-12-15: qty 1
  Filled 2011-12-15 (×5): qty 2

## 2011-12-15 MED ORDER — AMIODARONE HCL 200 MG PO TABS
400.0000 mg | ORAL_TABLET | Freq: Three times a day (TID) | ORAL | Status: DC
  Administered 2011-12-16 – 2011-12-18 (×7): 400 mg via ORAL
  Filled 2011-12-15 (×10): qty 2

## 2011-12-15 MED ORDER — TRAMADOL HCL 50 MG PO TABS: 50.0000 mg | ORAL_TABLET | ORAL | Status: DC | PRN

## 2011-12-15 MED ORDER — ASPIRIN 325 MG PO TBEC: 325.0000 mg | DELAYED_RELEASE_TABLET | Freq: Every day | ORAL | Status: AC

## 2011-12-15 MED ORDER — AMIODARONE HCL 200 MG PO TABS
400.0000 mg | ORAL_TABLET | Freq: Two times a day (BID) | ORAL | Status: DC
  Administered 2011-12-15 (×2): 400 mg via ORAL
  Filled 2011-12-15 (×2): qty 2

## 2011-12-15 MED ORDER — METFORMIN HCL 850 MG PO TABS
850.0000 mg | ORAL_TABLET | Freq: Two times a day (BID) | ORAL | Status: DC
  Administered 2011-12-15 – 2011-12-18 (×7): 850 mg via ORAL
  Filled 2011-12-15 (×10): qty 1

## 2011-12-15 MED FILL — Heparin Sodium (Porcine) Inj 1000 Unit/ML: INTRAMUSCULAR | Qty: 20 | Status: AC

## 2011-12-15 MED FILL — Heparin Sodium (Porcine) Inj 1000 Unit/ML: INTRAMUSCULAR | Qty: 30 | Status: AC

## 2011-12-15 MED FILL — Mannitol IV Soln 20%: INTRAVENOUS | Qty: 500 | Status: AC

## 2011-12-15 MED FILL — Sodium Bicarbonate IV Soln 8.4%: INTRAVENOUS | Qty: 50 | Status: AC

## 2011-12-15 MED FILL — Sodium Chloride Irrigation Soln 0.9%: Qty: 3000 | Status: AC

## 2011-12-15 MED FILL — Sodium Chloride IV Soln 0.9%: INTRAVENOUS | Qty: 1000 | Status: AC

## 2011-12-15 MED FILL — Electrolyte-R (PH 7.4) Solution: INTRAVENOUS | Qty: 3000 | Status: AC

## 2011-12-15 MED FILL — Lidocaine HCl IV Inj 20 MG/ML: INTRAVENOUS | Qty: 5 | Status: AC

## 2011-12-15 NOTE — Progress Notes (Signed)
2 Days Post-Op Procedure(s) (LRB): CORONARY ARTERY BYPASS GRAFTING (CABG) (N/A) Subjective: Mild chest wall discomfort  Objective: Vital signs in last 24 hours: Temp:  [98.4 F (36.9 C)-99.7 F (37.6 C)] 99.2 F (37.3 C) (04/12 0400) Pulse Rate:  [81-95] 82  (04/12 0600) Cardiac Rhythm:  [-] Normal sinus rhythm (04/12 0600) Resp:  [16-27] 20  (04/12 0600) BP: (93-128)/(57-78) 100/62 mmHg (04/12 0600) SpO2:  [92 %-98 %] 97 % (04/12 0600) Arterial Line BP: (98-126)/(59-72) 126/72 mmHg (04/11 1000) Weight:  [121.8 kg (268 lb 8.3 oz)] 121.8 kg (268 lb 8.3 oz) (04/12 0200)  Hemodynamic parameters for last 24 hours: PAP: (31-33)/(18-20) 33/18 mmHg  Intake/Output from previous day: 04/11 0701 - 04/12 0700 In: 940 [P.O.:660; I.V.:280] Out: 2520 [Urine:2490; Chest Tube:30] Intake/Output this shift:    General appearance: alert Heart: regular rate and rhythm and friction rub heard . Lungs: clear to auscultation bilaterally Extremities: extremities normal, atraumatic, no cyanosis or edema Wound: dressing dry  Lab Results:  Basename 12/15/11 0300 12/14/11 0400  WBC 13.2* 11.4*  HGB 10.4* 9.5*  HCT 31.1* 28.2*  PLT 152 143*   BMET:  Basename 12/15/11 0300 12/14/11 0400  NA 136 138  [name] 4.3 4.4  CL 100 108  CO2 29 23  GLUCOSE 111* 112*  BUN 21 18  CREATININE 0.89 0.65  CALCIUM 8.5 7.7*    PT/INR:  Basename 12/13/11 1530  LABPROT 18.7*  INR 1.53*   ABG    Component Value Date/Time   PHART 7.383 12/13/2011 1941   HCO3 23.5 12/13/2011 1941   TCO2 22 12/13/2011 2107   ACIDBASEDEF 1.0 12/13/2011 1941   O2SAT 97.0 12/13/2011 1941   CBG (last 3)   Basename 12/14/11 1942 12/14/11 1559 12/14/11 1213  GLUCAP 132* 155* 120*    Assessment/Plan: S/P Procedure(s) (LRB): CORONARY ARTERY BYPASS GRAFTING (CABG) (N/A) Mobilize Diuresis Diabetes control: Will resume glucophage and continue SSI. Plan for transfer to step-down: He can go to regular 2000 bed. LOS: 2 days     Daniel [name] 12/15/2011

## 2011-12-15 NOTE — Progress Notes (Signed)
Patient went into Atrial Fib rate 90's to 110's.  Orders received for Amiodarone 150mg  IV bolus and then to start on PO Amiodarone PO dose of 400mg  BID and to get EKG for tomorrow morning.

## 2011-12-16 ENCOUNTER — Encounter (HOSPITAL_COMMUNITY): Payer: Self-pay | Admitting: Emergency Medicine

## 2011-12-16 LAB — GLUCOSE, CAPILLARY: Glucose-Capillary: 101 mg/dL — ABNORMAL HIGH (ref 70–99)

## 2011-12-16 MED ORDER — TRAMADOL HCL 50 MG PO TABS: 50.0000 mg | ORAL_TABLET | Freq: Four times a day (QID) | ORAL | Status: DC | PRN

## 2011-12-16 NOTE — Progress Notes (Signed)
Attempted x 2 to ambulate pt. Pt stated he did not want to ambulate due to pain and having just taken pain relieving medication administered by pt's RN. Pt also stated he had been ambulating multiple times a day. Will follow up. Electronically signed by Harriett Sine MS on Saturday December 16 2011 at 1458

## 2011-12-16 NOTE — Progress Notes (Signed)
3 Days Post-Op Procedure(s) (LRB): CORONARY ARTERY BYPASS GRAFTING (CABG) (N/A)                     [address]            [place] [address]          [phone]      Subjective: A-fib s/p CABGx4 Current NSR  Objective: Vital signs in last 24 hours: Temp:  [98.3 F (36.8 C)-99.8 F (37.7 C)] 98.3 F (36.8 C) (04/13 0827) Pulse Rate:  [41-113] 77  (04/13 0800) Cardiac Rhythm:  [-] Normal sinus rhythm (04/13 0730) Resp:  [13-27] 16  (04/13 0800) BP: (89-101)/(57-75) 101/68 mmHg (04/13 0800) SpO2:  [91 %-100 %] 95 % (04/13 0800) Weight:  [270 lb 11.6 oz (122.8 kg)] 270 lb 11.6 oz (122.8 kg) (04/13 0600)  Hemodynamic parameters for last 24 hours:  stable  Intake/Output from previous day: 04/12 0701 - 04/13 0700 In: 1186 [P.O.:1080; I.V.:6; IV Piggyback:100] Out: 1200 [Urine:1200] Intake/Output this shift: Total I/O In: 60 [P.O.:60] Out: -   EXAM- lungs clear,incisions dry, extrem warm, abd soft  Lab Results:  Basename 12/15/11 0300 12/14/11 0400  WBC 13.2* 11.4*  HGB 10.4* 9.5*  HCT 31.1* 28.2*  PLT 152 143*   BMET:  Basename 12/15/11 0300 12/14/11 0400  NA 136 138  K 4.3 4.4  CL 100 108  CO2 29 23  GLUCOSE 111* 112*  BUN 21 18  CREATININE 0.89 0.65  CALCIUM 8.5 7.7*    PT/INR:  Basename 12/13/11 1530  LABPROT 18.7*  INR 1.53*   ABG    Component Value Date/Time   PHART 7.383 12/13/2011 1941   HCO3 23.5 12/13/2011 1941   TCO2 22 12/13/2011 2107   ACIDBASEDEF 1.0 12/13/2011 1941   O2SAT 97.0 12/13/2011 1941   CBG (last 3)   Basename 12/16/11 0825 12/15/11 2155 12/15/11 1618  GLUCAP 114* 120* 112*    Assessment/Plan: S/P Procedure(s) (LRB): CORONARY ARTERY BYPASS GRAFTING (CABG) (N/A) Plan for transfer to step-down: see transfer orders, cont po amio,no coumadin for now   LOS: 3 days    VAN TRIGT III,[place] Petron 12/16/2011

## 2011-12-16 NOTE — Progress Notes (Signed)
Patient went into afib rate 80's-130's. BP 92/73. EKG done. No chest pain at this time. Dr Donata Clay made aware of afib and EKG results. Orders received. Will contiune to monitor closely.

## 2011-12-17 ENCOUNTER — Inpatient Hospital Stay (HOSPITAL_COMMUNITY): Payer: Medicare Other

## 2011-12-17 DIAGNOSIS — Z951 Presence of aortocoronary bypass graft: Secondary | ICD-10-CM

## 2011-12-17 HISTORY — DX: Presence of aortocoronary bypass graft: Z95.1

## 2011-12-17 LAB — CBC
HCT: 30.6 % — ABNORMAL LOW (ref 39.0–52.0)
Hemoglobin: 10.5 g/dL — ABNORMAL LOW (ref 13.0–17.0)
MCH: 31.4 pg (ref 26.0–34.0)
MCHC: 34.3 g/dL (ref 30.0–36.0)
MCV: 91.6 fL (ref 78.0–100.0)
Platelets: 223 10*3/uL (ref 150–400)
RBC: 3.34 MIL/uL — ABNORMAL LOW (ref 4.22–5.81)
RDW: 12.7 % (ref 11.5–15.5)
WBC: 12.3 10*3/uL — ABNORMAL HIGH (ref 4.0–10.5)

## 2011-12-17 LAB — GLUCOSE, CAPILLARY: Glucose-Capillary: 87 mg/dL (ref 70–99)

## 2011-12-17 MED ORDER — POLYETHYLENE GLYCOL 3350 17 G PO PACK
17.0000 g | PACK | Freq: Once | ORAL | Status: DC
  Filled 2011-12-17: qty 1

## 2011-12-17 MED ORDER — OXYCODONE HCL 5 MG PO TABS: 5.0000 mg | ORAL_TABLET | ORAL | Status: AC | PRN

## 2011-12-17 MED ORDER — TRAMADOL HCL 50 MG PO TABS: 50.0000 mg | ORAL_TABLET | Freq: Four times a day (QID) | ORAL | Status: DC | PRN

## 2011-12-17 MED ORDER — AMIODARONE HCL 400 MG PO TABS: 400.0000 mg | ORAL_TABLET | Freq: Two times a day (BID) | ORAL | Status: DC

## 2011-12-17 NOTE — Discharge Instructions (Signed)
Coronary Artery Bypass Grafting Care After Refer to this sheet in the next few weeks. These instructions provide you with information on caring for yourself after your procedure. Your caregiver may also give you more specific instructions. Your treatment has been planned according to current medical practices, but problems sometimes occur. Call your caregiver if you have any problems or questions after your procedure.  Recovery from open heart surgery will be different for everyone. Some people feel well after 3 or 4 weeks, while for others it takes longer. After heart surgery, it may be normal to:  Not have an appetite, feel nauseated by the smell of food, or only want to eat a small amount.   Be constipated because of changes in your diet, activity, and medicines. Eat foods high in fiber. Add fresh fruits and vegetables to your diet. Stool softeners may be helpful.   Feel sad or unhappy. You may be frustrated or cranky. You may have good days and bad days. Do not give up. Talk to your caregiver if you do not feel better.   Feel weakness and fatigue. You many need physical therapy or cardiac rehabilitation to get your strength back.   Develop an irregular heartbeat called atrial fibrillation. Symptoms of atrial fibrillation are a fast, irregular heartbeat or feelings of fluttery heartbeats, shortness of breath, low blood pressure, and dizziness. If these symptoms develop, see your caregiver right away.  MEDICATION  Have a list of all the medicines you will be taking when you leave the hospital. For every medicine, know the following:   Name.   Exact dose.   Time of day to be taken.   How often it should be taken.   Why you are taking it.   Ask which medicines should or should not be taken together. If you take more than one heart medicine, ask if it is okay to take them together. Some heart medicines should not be taken at the same time because they may lower your blood pressure too  much.   Narcotic pain medicine can cause constipation. Eat fresh fruits and vegetables. Add fiber to your diet. Stool softener medicine may help relieve constipation.   Keep a copy of your medicines with you at all times.   Do not add or stop taking any medicine until you check with your caregiver.   Medicines can have side effects. Call your caregiver who prescribed the medicine if you:   Start throwing up, have diarrhea, or have stomach pain.   Feel dizzy or lightheaded when you stand up.   Feel your heart is skipping beats or is beating too fast or too slow.   Develop a rash.   Notice unusual bruising or bleeding.  HOME CARE INSTRUCTIONS  After heart surgery, it is important to learn how to take your pulse. Have your caregiver show you how to take your pulse.   Use your incentive spirometer. Ask your caregiver how long after surgery you need to use it.  Care of your chest incision  Tell your caregiver right away if you notice clicking in your chest (sternum).   Support your chest with a pillow or your arms when you take deep breaths and cough.   Follow your caregiver's instructions about when you can bathe or swim.   Protect your incision from sunlight during the first year to keep the scar from getting dark.   Tell your caregiver if you notice:   Increased tenderness of your incision.   Increased redness   or swelling around your incision.   Drainage or pus from your incision.  Care of your leg incision(s)  Avoid crossing your legs.   Avoid sitting for long periods of time. Change positions every half hour.   Elevate your leg(s) when you are sitting.   Check your leg(s) daily for swelling. Check the incisions for redness or drainage.   Wear your elastic stockings as told by your caregiver. Take them off at bedtime.  Diet  Diet is very important to heart health.   Eat plenty of fresh fruits and vegetables. Meats should be lean cut. Avoid canned, processed, and  fried foods.   Talk to a dietician. They can teach you how to make healthy food and drink choices.  Weight  Weigh yourself every day. This is important because it helps to know if you are retaining fluid that may make your heart and lungs work harder.   Use the same scale each time.   Weigh yourself every morning at the same time. You should do this after you go to the bathroom, but before you eat breakfast.   Your weight will be more accurate if you do not wear any clothes.   Record your weight.   Tell your caregiver if you have gained 2 pounds or more overnight.  Activity Stop any activity at once if you have chest pain, shortness of breath, irregular heartbeats, or dizziness. Get help right away if you have any of these symptoms.  Bathing.  Avoid soaking in a bath or hot tub until your incisions are healed.   Rest. You need a balance of rest and activity.   Exercise. Exercise per your caregiver's advice. You may need physical therapy or cardiac rehabilitation to help strengthen your muscles and build your endurance.   Climbing stairs. Unless your caregiver tells you not to climb stairs, go up stairs slowly and rest if you tire. Do not pull yourself up by the handrail.   Driving a car. Follow your caregiver's advice on when you may drive. You may ride as a passenger at any time. When traveling for long periods of time in a car, get out of the car and walk around for a few minutes every 2 hours.   Lifting. Avoid lifting, pushing, or pulling anything heavier than 10 pounds for 6 weeks after surgery or as told by your caregiver.   Returning to work. Check with your caregiver. People heal at different rates. Most people will be able to go back to work 6 to 12 weeks after surgery.   Sexual activity. You may resume sexual relations as told by your caregiver.  SEEK MEDICAL CARE IF:  Any of your incisions are red, painful, or have any type of drainage coming from them.   You have an  oral temperature above 102 F (38.9 C).   You have ankle or leg swelling.   You have pain in your legs.   You have weight gain of 2 or more pounds a day.   You feel dizzy or lightheaded when you stand up.  SEEK IMMEDIATE MEDICAL CARE IF:  You have angina or chest pain that goes to your jaw or arms. Call your local emergency services right away.   You have shortness of breath at rest or with activity.   You have a fast or irregular heartbeat (arrhythmia).   There is a "clicking" in your sternum when you move.   You have numbness or weakness in your arms or legs.    MAKE SURE YOU:  Understand these instructions.   Will watch your condition.   Will get help right away if you are not doing well or get worse.  Document Released: 03/10/2005 Document Revised: 08/10/2011 Document Reviewed: 10/26/2010 [facility] Patient Information 2012 [place], [place].  Endoscopic Saphenous Vein Harvesting A procedure called saphenous vein harvesting is done as part of some heart surgeries. Blood vessels that carry blood to the heart can become blocked. When that happens, a surgeon can create a detour (bypass) around the blocked area. To do this, the surgeon uses a healthy blood vessel from someplace else in your body. The vein that runs along the inside of your leg (greater saphenous vein) is often used. It goes from your ankle to your groin. Saphenous vein harvesting is the procedure that is done to take part of this vein from your leg. For an endoscopic procedure, only very small cuts (incisions) are needed. The surgeon uses a tiny tool (endoscope) to find and take out part of the vein.  LET YOUR CAREGIVER KNOW ABOUT:  Allergies to food or medicine.   Medicines taken, including vitamins, herbs, eyedrops, over-the-counter medicines, and creams.   Use of steroids (by mouth or creams).   Previous problems with anesthetics or numbing medicines.   History of bleeding problems or blood clots.   Previous  surgery.   Problems with your leg veins, such as painful or enlarged (varicose) veins.   Other health problems, including diabetes, history of wound problems, and kidney problems.   Possibility of pregnancy, if this applies.  RISKS AND COMPLICATIONS Saphenous vein harvesting is done as a part of heart surgery.The heart surgery may have risks. However, there are usually few problems with the vein harvesting part of surgery. This is especially true with an endoscopic procedure. However, problems can occur, such as:  Bleeding in the leg.   Bleeding under the skin.   Infection.   Leg pain.   Leg swelling.   Nerve damage.  Some people are more likely than others to have problems with saphenous vein harvesting. They include:  Women.   People with diabetes.   People who are overweight.   People who smoke.   People who have had a blood vessel disease in their legs.   People who have varicose leg veins.  BEFORE THE PROCEDURE  A medical evaluation will be done. This may include an ultrasound exam to make sure your saphenous vein is healthy.   If you smoke, quit a few weeks before your surgery.   A week before the procedure, stop taking drugs that can cause bleeding during and after your surgery. This includes aspirin, nonsteroidal anti-inflammatory drugs, ibuprofen, and naproxen. Also stop taking vitamin E.   If you take blood thinners, ask your surgeon when you should stop taking them before surgery.   The day before the surgery, eat only a light dinner. Do not eat or drink anything after midnight. Ask your caregiver if it is okay to take any needed medicines with a sip of water.   Arrive at least 1 hour before the surgery, or as directed by your surgeon.  PROCEDURE Saphenous vein harvesting will be done at the very beginning of your heart surgery. It is usually done while your heart surgery is being started.  Small monitors will be put on your body. They are used to check  your heart, blood pressure, and oxygen level.   You will be given an intravenous line (IV). A needle will be put  in your arm or hand. It is hooked to a plastic tube. Medicine will flow directly into your body through the IV.   You will be given medicine that makes you sleep (general anesthetic).   A tube will be put in your throat to help you breathe during the surgery. It will also be used to give you anesthetic medicine during the surgery.   A soft tube (catheter) may be put in your bladder to drain urine during and after the surgery.   Your leg will be cleaned with a germ-killing (antiseptic) solution.   When you are asleep, 1 to 3 incisions will be made on the inside of your leg. The endoscope will be put into these incisions. The endoscope is a long, thin tube with a tiny camera on the end. The surgeon will use this tool to find a segment of vein that will work for the bypass. This segment is cut out.   Clips, ties, or electric current (cautery) may be used to seal off the vein. These methods stop the bleeding. Other veins will take over for the one that is removed to keep your leg healthy.   The surgeon will close the incisions. Clips or small stitches (sutures) may be used.   A drain may be placed under the skin of your leg. It looks like a long, thin tube, and it allows fluids to drain out of your leg after the surgery.   Your leg will probably be wrapped in an elastic bandage or stocking.   The vein that was harvested will be used in your heart surgery.  AFTER THE PROCEDURE  When your heart surgery is over, you will probably be taken directly to the intensive care unit (ICU). You will continue to use a breathing machine (ventilator) and get fluids through the IV for a while.   When no more fluid drains from your leg, the drain in your leg will be taken out.   You will be able to go home once you are recovered from your heart surgery. Most people stay in the hospital for at  least 4 days. You may spend 1 to several days in an intensive care area. Then you may spend several days in a regular hospital room.  Document Released: 05/03/2011 Document Revised: 08/10/2011 Document Reviewed: 05/03/2011 [facility] Patient Information 2012 [place], [place].  Atrial Fibrillation Atrial fibrillation is an abnormal heartbeat (rhythm). It can cause your heart rate to be faster or slower than normal, and can cause clots of blood to form in your heart. These clots can cause other health problems. Atrial fibrillation may be caused by a heart attack, lung problem, or certain medicine. Sometimes the cause of atrial fibrillation is not found. HOME CARE  Take blood thinning medicine (anticoagulants) as told by your doctor. Your doctor will need to draw your blood to check lab values if you take blood thinners.   If you had a cardioversion, limit your activity as told by your doctor.   Learn how to check your heartbeat (pulse) for an abnormal or irregular beat. Your doctor can show you how.   Ask your doctor if it is okay to exercise.   Only take medicine as told by your doctor.  GET HELP RIGHT AWAY IF:   You have trouble breathing or feel dizzy.   You have puffy (swollen) feet or ankles.   You have blood in your pee (urine) or poop (bowel movement).   You feel your heart "skipping" beats.  You feel your heart "racing" or beating fast.   You have weakness in your arms or legs.   You have trouble talking, seeing, or thinking.   You have chest pain or pain in your arm or jaw.  MAKE SURE YOU:   Understand these instructions.   Will watch your condition.   Will get help right away if you are not doing well or get worse.  Document Released: 05/30/2008 Document Revised: 08/10/2011 Document Reviewed: 12/09/2009 [facility] Patient Information 2012 [place], [place].

## 2011-12-17 NOTE — Discharge Summary (Signed)
Physician Discharge Summary  Patient ID: Daniel [name] MRN: [id number] DOB/AGE: 66-Mar-1947 66 y.o.  Admit date: 12/13/2011 Discharge date: 12/17/2011  Admission Diagnoses:  Patient Active Problem List  Diagnoses  . HYPERLIPIDEMIA  . TOBACCO ABUSE  . HYPERTENSION  . CAROTID OCCLUSIVE DISEASE  . HTN (hypertension)  . Diabetes mellitus type 2, noninsulin dependent  . CAD (coronary artery disease)  . STEMI (ST elevation myocardial infarction)  . HLD (hyperlipidemia)  . Tobacco abuse  . Obesity  . S/P CABG x 5   Discharge Diagnoses:   Patient Active Problem List  Diagnoses  . HYPERLIPIDEMIA  . TOBACCO ABUSE  . HYPERTENSION  . CAROTID OCCLUSIVE DISEASE  . HTN (hypertension)  . Diabetes mellitus type 2, noninsulin dependent  . CAD (coronary artery disease)  . STEMI (ST elevation myocardial infarction)  . HLD (hyperlipidemia)  . Tobacco abuse  . Obesity  . S/P CABG x 5   Discharged Condition: good  History of Present Illness:  Daniel [name] is a 66 yo white male with known history of HTN, diabetes, obesity, and cigar smoking.  He presented to the [facility] ED on 12/01/2011 with a complaint of chest pain.  The patient stated he developed severe chest pain located substernally while watching television.  The patient also complained of some shortness of breath.  Workup in the ED consisted of EKG which revealed ST segment changes and elevation of cardiac enzymes.  The patient was taken emergently to the cardiac catheterization lab.  He was found to have a preserved EF and multivessel CAD.  It was felt his STEMI was due to an occluded RCA.  Attempts were made to open the artery, but were unsuccessful.  Due to these findings and inability for stents to be placed, cardiothoracic surgery was consulted for possible bypass procedure.  Dr. Laneta Simmers evaluated the patient on 12/02/2011 at which time it was felt that surgery would be his best treatment option to decrease the risk of  further ischemia or infarction.  The patient had been loaded with Plavix prior to his catheterization procedure and would need to wait approximately 5 days for this medication to be broken down in his system.  The risks and benefits were explained to the patient and his family  He was agreeable to proceed, but he requested to be discharged home prior to the procedure.  The patient was discharged home on 12/05/2011 and set to follow up on an outpatient basis for his surgery.  Of note the patient was placed on an ACE-I at d/c.  The patient developed angioedema of his tongue and throat resulting in presentation to the ED on 12/12/2011.  The swelling resolved and the patient was taken off his ACE-I.  Hospital Course:   On 12/13/2011 the patient presented to [facility] and was taken to the OR.  He underwent CABG x 5 utilizing LIMA to LAD, SVG to Diagonal, SVG to Intermediate and OM, SVG to RCA.  The patient also underwent endoscopic saphenous vein harvest of his RLE.  The patient tolerated the procedure well and was taken to the ICU in stable condition.  POD #0 the patient was extubated.  POD #1 the patients arterial lines were removed without difficulty.  POD #2 the patient developed atrial fibrillation.  He was treated accordingly with Amiodarone.   POD #3 the patients chest tubes were removed without difficulty.  He again had episodes of Atrial Fibrillation.  POD #4 the patients chest xray shows no evidence of pneumothorax.  He does have small bilateral pleural effusions.  He is NSR this morning.  His EPW were removed without difficulty.  The patient is doing very well.  Should no further problems arise we anticipate discharge in the next 24-48 hours.  Disposition: 01-Home or Self Care  Discharge Orders    Future Appointments: Provider: Department: Dept Phone: Center:   12/28/2011 11:00 AM Herby Abraham, MD [facility] [phone] [facility]   01/09/2012 12:00 PM Alleen Borne, MD  [facility] [phone] [id number]     Medication List  As of 12/17/2011 11:44 AM   STOP taking these medications         lisinopril 10 MG tablet      nitroGLYCERIN 0.4 MG SL tablet         TAKE these medications         acyclovir 200 MG capsule   Commonly known as: ZOVIRAX   Take 400 mg by mouth 3 (three) times daily as needed. For cold sores      amiodarone 400 MG tablet   Commonly known as: PACERONE   Take 1 tablet (400 mg total) by mouth 2 (two) times daily.      aspirin 325 MG EC tablet   Take 1 tablet (325 mg total) by mouth daily.      atorvastatin 80 MG tablet   Commonly known as: LIPITOR   Take 1 tablet (80 mg total) by mouth at bedtime.      carvedilol 6.25 MG tablet   Commonly known as: COREG   Take 1 tablet (6.25 mg total) by mouth 2 (two) times daily with a meal.      diphenhydrAMINE 25 MG tablet   Commonly known as: BENADRYL   Take 1 tablet (25 mg total) by mouth every 6 (six) hours. For three days, then as needed      famotidine 20 MG tablet   Commonly known as: PEPCID   Take 1 tablet (20 mg total) by mouth 2 (two) times daily. Take 1 tablet PO Q 12 hrs x 3 days then as needed      furosemide 40 MG tablet   Commonly known as: LASIX   Take 1 tablet (40 mg total) by mouth daily.      metFORMIN 850 MG tablet   Commonly known as: GLUCOPHAGE   Take 850 mg by mouth 2 (two) times daily with a meal.      mulitivitamin with minerals Tabs   Take 1 tablet by mouth daily.      oxyCODONE 5 MG immediate release tablet   Commonly known as: Oxy IR/ROXICODONE   Take 1-2 tablets (5-10 mg total) by mouth every 4 (four) hours as needed.      potassium chloride SA 20 MEQ tablet   Commonly known as: K-DUR,KLOR-CON   Take 1 tablet (20 mEq total) by mouth daily. X 1 week      traMADol 50 MG tablet   Commonly known as: ULTRAM   Take 1-2 tablets (50-100 mg total) by mouth every 6 (six) hours as needed for pain.      traMADol 50 MG tablet   Commonly known as: ULTRAM     Take 1 tablet (50 mg total) by mouth every 6 (six) hours as needed.           Follow-up Information    Follow up with [name], MD. Schedule an appointment as soon as possible for a visit in 2 weeks.   Contact information:  [address] [place] [address] [phone]       Follow up with Alleen Borne, MD on 01/09/2012. (have a chest x-ray at 11:00, then see MD at 12:00)    Contact information:   [address] [place] [address] [phone]          Signed: Lowella Dandy 12/17/2011, 11:44 AM

## 2011-12-17 NOTE — Progress Notes (Signed)
Patient ambulated independently on room air with significant other approximately 1041ft at a steady, fast pace. Patient tolerated ambulation well. Vital signs stable.  Patient returned to bed and resting. Will continue to monitor.

## 2011-12-17 NOTE — Progress Notes (Addendum)
Subjective:   Daniel [name] has complains of incisional soreness today.  Otherwise he looks well.  Objective:  Vital Signs in the last 24 hours: Temp:  [97.9 F (36.6 C)-99.4 F (37.4 C)] 99.4 F (37.4 C) (04/14 0409) Pulse Rate:  [67-76] 67  (04/14 0409) Resp:  [18-20] 19  (04/14 0409) BP: (86-108)/(59-71) 108/71 mmHg (04/14 0409) SpO2:  [90 %-99 %] 97 % (04/14 0409) Weight:  [267 lb 1.6 oz (121.156 kg)] 267 lb 1.6 oz (121.156 kg) (04/14 0409)  Intake/Output from previous day: 04/13 0701 - 04/14 0700 In: 420 [P.O.:420] Out: 1100 [Urine:1100] Intake/Output from this shift:    Physical Exam: General appearance: alert, cooperative and no distress Lungs: clear to auscultation bilaterally Heart: regular rate and rhythm Abdomen: soft, non-tender; bowel sounds normal; no masses,  no organomegaly Extremities: edema 1+ Skin: incisions clean and dry  Lab Results:  Basename 12/17/11 0645 12/15/11 0300  WBC 12.3* 13.2*  HGB 10.5* 10.4*  PLT 223 152    Basename 12/15/11 0300  NA 136  K 4.3  CL 100  CO2 29  GLUCOSE 111*  BUN 21  CREATININE 0.89   No results found for this basename: TROPONINI:2,CK,MB:2 in the last 72 hours Hepatic Function Panel No results found for this basename: PROT,ALBUMIN,AST,ALT,ALKPHOS,BILITOT,BILIDIR,IBILI in the last 72 hours No results found for this basename: CHOL in the last 72 hours No results found for this basename: PROTIME in the last 72 hours  Imaging: Imaging results have been reviewed: pleural effusion on right  Assessment/Plan:   1. S/P CABG- doing well 2. CV- NSR, blood pressure and HR well controlled will continue Amiodarone/Coreg, will d/c EPW 3. Resp- no acute issues, patient off oxygen, will continue IS 4. Fluid Balance- patients weight is up 5kg from admission, will order low dose Lasix 5. Dispo- patient doing very well, will remove EPW today, if no arrythmia develops patient will be ready for d/c in next 24-48 hours    LOS: 4 days    BARRETT, ERIN 12/17/2011, 9:08 AM    patient examined and medical record reviewed,agree with above note. VAN TRIGT III,Cherril Hett 12/17/2011

## 2011-12-17 NOTE — Progress Notes (Signed)
EPW discontinued per protocol. Tips intact. Patient tolerated well. Last INR INR/Prothrombin Time on   .  Patient advised Bedrest X 1 hour. Teagen Bucio Forrest   

## 2011-12-17 NOTE — Plan of Care (Signed)
Problem: Phase III Progression Outcomes Goal: Time patient transferred to PCTU/Telemetry POD Outcome: Completed/Met Date Met:  12/17/11 Patient transferred over at 1152 on 12/16/11

## 2011-12-18 LAB — GLUCOSE, CAPILLARY: Glucose-Capillary: 97 mg/dL (ref 70–99)

## 2011-12-18 MED ORDER — POTASSIUM CHLORIDE CRYS ER 20 MEQ PO TBCR: 20.0000 meq | EXTENDED_RELEASE_TABLET | Freq: Every day | ORAL | Status: DC

## 2011-12-18 MED ORDER — FUROSEMIDE 40 MG PO TABS: 40.0000 mg | ORAL_TABLET | Freq: Every day | ORAL | Status: DC

## 2011-12-18 MED FILL — Potassium Chloride Inj 2 mEq/ML: INTRAVENOUS | Qty: 40 | Status: AC

## 2011-12-18 MED FILL — Magnesium Sulfate Inj 50%: INTRAMUSCULAR | Qty: 10 | Status: AC

## 2011-12-18 NOTE — Progress Notes (Signed)
Patient watching Recovering from Heart Surgery video.  Will continue to monitor.

## 2011-12-18 NOTE — Progress Notes (Signed)
[id number] Cardiac Rehab Completed discharge education with pt, friend and daughter. Pt agrees to Outpt. CRP in [place], will send referral.

## 2011-12-18 NOTE — Progress Notes (Signed)
Subjective:  Mr. Steel has no complaint this morning.   Objective:  Vital Signs in the last 24 hours: Temp:  [97.1 F (36.2 C)-98.7 F (37.1 C)] 97.1 F (36.2 C) (04/15 0745) Pulse Rate:  [64-73] 71  (04/15 0745) Resp:  [18-20] 18  (04/15 0745) BP: (91-121)/(59-78) 102/73 mmHg (04/15 0745) SpO2:  [93 %-98 %] 93 % (04/15 0745) Weight:  [267 lb 12.8 oz (121.473 kg)] 267 lb 12.8 oz (121.473 kg) (04/15 0412)  Intake/Output from previous day: 04/14 0701 - 04/15 0700 In: 720 [P.O.:720] Out: 750 [Urine:750] Intake/Output from this shift: Total I/O In: 240 [P.O.:240] Out: -   Physical Exam: General appearance: alert, cooperative and no distress Lungs: clear to auscultation bilaterally Heart: regular rate and rhythm Abdomen: soft, non-tender; bowel sounds normal; no masses,  no organomegaly Extremities: edema trace Skin: incisions clean and dry  Lab Results:  Basename 12/17/11 0645  WBC 12.3*  HGB 10.5*  PLT 223   No results found for this basename: NA:2,K:2,CL:2,CO2:2,GLUCOSE:2,BUN:2,CREATININE:2 in the last 72 hours No results found for this basename: TROPONINI:2,CK,MB:2 in the last 72 hours Hepatic Function Panel No results found for this basename: PROT,ALBUMIN,AST,ALT,ALKPHOS,BILITOT,BILIDIR,IBILI in the last 72 hours No results found for this basename: CHOL in the last 72 hours No results found for this basename: PROTIME in the last 72 hours  Assessment/Plan:   1. S/P CABG doing well 2. CV- NSR, heart rate and blood pressure well controlled with coreg and amiodarone 3. Fluid Balance- patients weight is up 4kg from admission, will d/c home on daily lasix 4. Dispo- patient doing well will d/c home today  LOS: 5 days    Ericson Nafziger 12/18/2011, 8:35 AM

## 2011-12-18 NOTE — Progress Notes (Signed)
Sutures X3 to abdomen d/c'd per order.  Steri strips applied.  Discharge instructions reviewed with patient and daughter.  Both verbalize understanding of all instructions and medications.

## 2011-12-28 ENCOUNTER — Encounter: Payer: Self-pay | Admitting: Cardiology

## 2011-12-28 ENCOUNTER — Ambulatory Visit (INDEPENDENT_AMBULATORY_CARE_PROVIDER_SITE_OTHER)
Admission: RE | Admit: 2011-12-28 | Discharge: 2011-12-28 | Disposition: A | Discharge status: Home / Self Care | Payer: Medicare Other | Source: Ambulatory Visit | Attending: Cardiology | Admitting: Cardiology

## 2011-12-28 ENCOUNTER — Ambulatory Visit (INDEPENDENT_AMBULATORY_CARE_PROVIDER_SITE_OTHER): Payer: Medicare Other | Admitting: Cardiology

## 2011-12-28 VITALS — BP 112/72 | HR 57 | Ht 71.5 in | Wt 252.8 lb

## 2011-12-28 DIAGNOSIS — I2510 Atherosclerotic heart disease of native coronary artery without angina pectoris: Secondary | ICD-10-CM

## 2011-12-28 DIAGNOSIS — I4891 Unspecified atrial fibrillation: Secondary | ICD-10-CM | POA: Insufficient documentation

## 2011-12-28 DIAGNOSIS — Z951 Presence of aortocoronary bypass graft: Secondary | ICD-10-CM

## 2011-12-28 DIAGNOSIS — E119 Type 2 diabetes mellitus without complications: Secondary | ICD-10-CM

## 2011-12-28 HISTORY — DX: Unspecified atrial fibrillation: I48.91

## 2011-12-28 LAB — CBC WITH DIFFERENTIAL/PLATELET
Eosinophils Relative: 1.6 % (ref 0.0–5.0)
HCT: 35.8 % — ABNORMAL LOW (ref 39.0–52.0)
Hemoglobin: 12 g/dL — ABNORMAL LOW (ref 13.0–17.0)
Lymphs Abs: 2.1 10*3/uL (ref 0.7–4.0)
Monocytes Relative: 7.4 % (ref 3.0–12.0)
Platelets: 312 10*3/uL (ref 150.0–400.0)
WBC: 10.9 10*3/uL — ABNORMAL HIGH (ref 4.5–10.5)

## 2011-12-28 LAB — BASIC METABOLIC PANEL
GFR: 64.4 mL/min (ref >60.00)
Potassium: 5.2 mEq/L — ABNORMAL HIGH (ref 3.5–5.1)
Sodium: 138 mEq/L (ref 135–145)

## 2011-12-28 MED ORDER — AMIODARONE HCL 200 MG PO TABS: 200.0000 mg | ORAL_TABLET | Freq: Every day | ORAL | Status: DC

## 2011-12-28 NOTE — Assessment & Plan Note (Signed)
Check BMET today 

## 2011-12-28 NOTE — Progress Notes (Signed)
HPI:  Patient is post CABG and overall stable.  He has a lot of discomfort in the left lateral chest up under the underarm, and around to the back.  No diaphoresis or shortness of breath. Pain is there all the time.  Not associated with inspiration.  Nothing like heart attack.  Getting around ok.  Corresponds to cutting back medications.  Had atrial fib in the hospital and placed on Amio 400 bid which he has been taking regularly.  No nausea Current Outpatient Prescriptions  Medication Sig Dispense Refill  . acyclovir (ZOVIRAX) 200 MG capsule Take 400 mg by mouth 3 (three) times daily as needed. For cold sores      . amiodarone (PACERONE) 200 MG tablet Take 1 tablet (200 mg total) by mouth daily.  30 tablet  6  . aspirin 81 MG tablet Take 81 mg by mouth daily.      Marland Kitchen atorvastatin (LIPITOR) 80 MG tablet Take 1 tablet (80 mg total) by mouth at bedtime.  30 tablet  2  . bisacodyl (DULCOLAX) 5 MG EC tablet Take 5 mg by mouth daily as needed.      . carvedilol (COREG) 6.25 MG tablet Take 1 tablet (6.25 mg total) by mouth 2 (two) times daily with a meal.  60 tablet  6  . diphenhydrAMINE (BENADRYL) 25 MG tablet Take 1 tablet (25 mg total) by mouth every 6 (six) hours. For three days, then as needed  20 tablet  0  . docusate sodium (COLACE) 100 MG capsule Take 100 mg by mouth 2 (two) times daily.      . famotidine (PEPCID) 20 MG tablet Take 1 tablet (20 mg total) by mouth 2 (two) times daily. Take 1 tablet PO Q 12 hrs x 3 days then as needed  30 tablet  0  . metFORMIN (GLUCOPHAGE) 850 MG tablet Take 850 mg by mouth 2 (two) times daily with a meal.      . Multiple Vitamin (MULITIVITAMIN WITH MINERALS) TABS Take 1 tablet by mouth daily.      . traMADol (ULTRAM) 50 MG tablet Take 1 tablet (50 mg total) by mouth every 6 (six) hours as needed.  30 tablet  0  . DISCONTD: amiodarone (PACERONE) 400 MG tablet Take 1 tablet (400 mg total) by mouth 2 (two) times daily.  60 tablet  1  . oxyCODONE (OXY IR/ROXICODONE)  5 MG immediate release tablet Take 1-2 tablets (5-10 mg total) by mouth every 4 (four) hours as needed.  50 tablet  0    Allergies  Allergen Reactions  . Lisinopril Swelling    Swollen tongue   . Penicillins Hives and Rash    Past Medical History  Diagnosis Date  . HTN (hypertension)   . Diabetes mellitus type 2, noninsulin dependent     a. diagnosed 2012  . Coronary artery disease   . Myocardial infarction   . Diabetes mellitus   . GERD (gastroesophageal reflux disease)     Past Surgical History  Procedure Date  . Cardiac catheterization     12-01-2011  . Left arm surgery as a child   . Coronary artery bypass graft 12/13/2011    Procedure: CORONARY ARTERY BYPASS GRAFTING (CABG);  Surgeon: Alleen Borne, MD;  Location: [facility] OR;  Service: Open Heart Surgery;  Laterality: N/A;  Times five, on pump, using endoscopically harvested right greater saphenous vein and left internal mammary artery.     Family History  Problem Relation Age of Onset  .  Heart attack Father     died @ 49's  . Heart attack Mother     died @ 54's  . Heart attack Brother     died @ 63  . Heart attack Brother     died @ 54  . Anesthesia problems Neg Hx   . Hypotension Neg Hx   . Malignant hyperthermia Neg Hx   . Pseudochol deficiency Neg Hx     History   Social History  . Marital Status: Widowed    Spouse Name: N/A    Number of Children: N/A  . Years of Education: N/A   Occupational History  . Not on file.   Social History Main Topics  . Smoking status: Former Smoker -- 2 years    Types: Cigars  . Smokeless tobacco: Not on file   Comment: smokes 1 cigar/day for many years  . Alcohol Use: No  . Drug Use: No  . Sexually Active: Not Currently   Other Topics Concern  . Not on file   Social History Narrative   Pt lives by himself in [place].  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  He does not routinely exercise or adhere to any particular diet.    ROS: Please see the HPI.  All other systems reviewed  and negative.  PHYSICAL EXAM:  BP 112/72  Pulse 57  Ht 5' 11.5" (1.816 m)  Wt 252 lb 12.8 oz (114.669 kg)  BMI 34.77 kg/m2  General: Well developed, well nourished, in no acute distress. Head:  Normocephalic and atraumatic. Neck: no JVD Lungs: Clear to auscultation and percussion.  No clinical evidence of effusion. MS well healed.   Heart: Normal S1 and S2.  No murmur, rubs or gallops.  Abdomen:  Normal bowel sounds; soft; non tender; no organomegaly Pulses: Pulses normal in all 4 extremities. Extremities: No clubbing or cyanosis. No edema. Neurologic: Alert and oriented x 3.  EKG:  SB with first degree av block.  PR .  Inferior MI.  T inversion inferiorly.   ASSESSMENT AND PLAN:

## 2011-12-28 NOTE — Patient Instructions (Signed)
A chest x-ray takes a picture of the organs and structures inside the chest, including the heart, lungs, and blood vessels. This test can show several things, including, whether the heart is enlarges; whether fluid is building up in the lungs; and whether pacemaker / defibrillator leads are still in place. ([place] office on [facility])  Your physician has recommended you make the following change in your medication: DECREASE Amiodarone to 200mg  take one by mouth daily  Your physician recommends that you schedule a follow-up appointment in: 2 WEEKS  Your physician recommends that you have lab work today: BMP and CBC

## 2011-12-28 NOTE — Assessment & Plan Note (Signed)
Patient is post CABG. Has left sided chest pain---constant, no associated symptoms, and no definite clinical findings.  NO acute ECG changes.  Will check CXR and labs.  May from IMA harvest.

## 2011-12-28 NOTE — Assessment & Plan Note (Signed)
Will cut back on Amio to 200mg  per day.  Told him he could have recurrent af, and that we need close follow up to make sure he does not have progressive bradycardia.  Explain to patient and daughters.

## 2012-01-01 ENCOUNTER — Other Ambulatory Visit (INDEPENDENT_AMBULATORY_CARE_PROVIDER_SITE_OTHER): Payer: Self-pay

## 2012-01-01 ENCOUNTER — Other Ambulatory Visit: Payer: Self-pay

## 2012-01-01 DIAGNOSIS — I2510 Atherosclerotic heart disease of native coronary artery without angina pectoris: Secondary | ICD-10-CM

## 2012-01-01 DIAGNOSIS — I10 Essential (primary) hypertension: Secondary | ICD-10-CM

## 2012-01-01 LAB — BASIC METABOLIC PANEL
CO2: 27 mEq/L (ref 19–32)
Calcium: 9.4 mg/dL (ref 8.4–10.5)
Chloride: 101 mEq/L (ref 96–112)
Glucose, Bld: 180 mg/dL — ABNORMAL HIGH (ref 70–99)
Sodium: 137 mEq/L (ref 135–145)

## 2012-01-02 ENCOUNTER — Other Ambulatory Visit: Payer: Self-pay

## 2012-01-02 DIAGNOSIS — E875 Hyperkalemia: Secondary | ICD-10-CM

## 2012-01-03 ENCOUNTER — Telehealth: Payer: Self-pay | Admitting: Cardiology

## 2012-01-03 NOTE — Telephone Encounter (Signed)
Pt rtn your call from yesterday evening because he got cut off

## 2012-01-04 ENCOUNTER — Other Ambulatory Visit: Payer: Self-pay

## 2012-01-04 ENCOUNTER — Other Ambulatory Visit (INDEPENDENT_AMBULATORY_CARE_PROVIDER_SITE_OTHER): Payer: Medicare Other

## 2012-01-04 DIAGNOSIS — E875 Hyperkalemia: Secondary | ICD-10-CM

## 2012-01-04 LAB — BASIC METABOLIC PANEL
BUN: 22 mg/dL (ref 6–23)
CO2: 28 mEq/L (ref 19–32)
Chloride: 105 mEq/L (ref 96–112)
Creatinine, Ser: 0.8 mg/dL (ref 0.4–1.5)
Glucose, Bld: 88 mg/dL (ref 70–99)

## 2012-01-04 NOTE — Telephone Encounter (Signed)
(  I was not in the office on 01/03/12) Left message to let pt know that he needs to have a BMP redrawn today.

## 2012-01-05 ENCOUNTER — Other Ambulatory Visit: Payer: Self-pay | Admitting: Surgery

## 2012-01-05 ENCOUNTER — Encounter: Payer: Self-pay | Admitting: Cardiology

## 2012-01-05 DIAGNOSIS — I2510 Atherosclerotic heart disease of native coronary artery without angina pectoris: Secondary | ICD-10-CM

## 2012-01-05 NOTE — Telephone Encounter (Signed)
Potassium 5.3 on repeat blood draw.  The pt will go today and have a repeat BMP drawn at [facility].

## 2012-01-08 ENCOUNTER — Ambulatory Visit (INDEPENDENT_AMBULATORY_CARE_PROVIDER_SITE_OTHER): Payer: Medicare Other | Admitting: Cardiology

## 2012-01-08 ENCOUNTER — Encounter: Payer: Self-pay | Admitting: Cardiology

## 2012-01-08 VITALS — BP 120/80 | HR 63 | Ht 71.0 in | Wt 245.8 lb

## 2012-01-08 DIAGNOSIS — I2510 Atherosclerotic heart disease of native coronary artery without angina pectoris: Secondary | ICD-10-CM

## 2012-01-08 DIAGNOSIS — E875 Hyperkalemia: Secondary | ICD-10-CM | POA: Insufficient documentation

## 2012-01-08 DIAGNOSIS — E119 Type 2 diabetes mellitus without complications: Secondary | ICD-10-CM

## 2012-01-08 DIAGNOSIS — I4891 Unspecified atrial fibrillation: Secondary | ICD-10-CM

## 2012-01-08 NOTE — Patient Instructions (Signed)
Your physician recommends that you schedule a follow-up appointment in: 1 MONTH with Dr Riley Kill  Your physician recommends that you continue on your current medications as directed. Please refer to the Current Medication list given to you  Today.  Your physician recommends that you return for lab work in: 2 WEEKS (BMP)

## 2012-01-08 NOTE — Assessment & Plan Note (Signed)
Maintaining NSR.  Continue to monitor.  DC Amiodarone when seen in one month follow up.

## 2012-01-08 NOTE — Progress Notes (Signed)
HPI:  Patient is doing well.  He had right lateral chest discomfort, which is worse when he roles over.  Denies other symptoms.  K was elevated on several draws  (5.3), but repeat in another lab was 4.9.  We discussed high K foods.    Current Outpatient Prescriptions  Medication Sig Dispense Refill  . acyclovir (ZOVIRAX) 200 MG capsule Take 400 mg by mouth 3 (three) times daily as needed. For cold sores      . amiodarone (PACERONE) 200 MG tablet Take 1 tablet (200 mg total) by mouth daily.  30 tablet  6  . aspirin 81 MG tablet Take 81 mg by mouth daily.      Marland Kitchen atorvastatin (LIPITOR) 80 MG tablet Take 1 tablet (80 mg total) by mouth at bedtime.  30 tablet  2  . bisacodyl (DULCOLAX) 5 MG EC tablet Take 5 mg by mouth as needed.       . carvedilol (COREG) 6.25 MG tablet Take 1 tablet (6.25 mg total) by mouth 2 (two) times daily with a meal.  60 tablet  6  . diphenhydrAMINE (BENADRYL) 25 MG tablet Take 1 tablet (25 mg total) by mouth every 6 (six) hours. For three days, then as needed  20 tablet  0  . docusate sodium (COLACE) 100 MG capsule Take 100 mg by mouth as needed.       . metFORMIN (GLUCOPHAGE) 850 MG tablet Take 850 mg by mouth 2 (two) times daily with a meal.      . Multiple Vitamin (MULITIVITAMIN WITH MINERALS) TABS Take 1 tablet by mouth daily.        Allergies  Allergen Reactions  . Lisinopril Swelling    Swollen tongue   . Penicillins Hives and Rash    Past Medical History  Diagnosis Date  . HTN (hypertension)   . Diabetes mellitus type 2, noninsulin dependent     a. diagnosed 2012  . Coronary artery disease   . Myocardial infarction   . Diabetes mellitus   . GERD (gastroesophageal reflux disease)   . Atrial fibrillation 12/28/2011    Past Surgical History  Procedure Date  . Cardiac catheterization     12-01-2011  . Left arm surgery as a child   . Coronary artery bypass graft 12/13/2011    Procedure: CORONARY ARTERY BYPASS GRAFTING (CABG);  Surgeon: Alleen Borne,  MD;  Location: [facility] OR;  Service: Open Heart Surgery;  Laterality: N/A;  Times five, on pump, using endoscopically harvested right greater saphenous vein and left internal mammary artery.     Family History  Problem Relation Age of Onset  . Heart attack Father     died @ 73's  . Heart attack Mother     died @ 59's  . Heart attack Brother     died @ 49  . Heart attack Brother     died @ 108  . Anesthesia problems Neg Hx   . Hypotension Neg Hx   . Malignant hyperthermia Neg Hx   . Pseudochol deficiency Neg Hx     History   Social History  . Marital Status: Widowed    Spouse Name: N/A    Number of Children: N/A  . Years of Education: N/A   Occupational History  . Not on file.   Social History Main Topics  . Smoking status: Former Smoker -- 2 years    Types: Cigars  . Smokeless tobacco: Not on file   Comment: smokes 1 cigar/day  for many years  . Alcohol Use: No  . Drug Use: No  . Sexually Active: Not Currently   Other Topics Concern  . Not on file   Social History Narrative   Pt lives by himself in [address].  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  He does not routinely exercise or adhere to any particular diet.    ROS: Please see the HPI.  All other systems reviewed and negative.  PHYSICAL EXAM:  BP 120/80  Pulse 63  Ht 5\' 11"  (1.803 m)  Wt 245 lb 12.8 oz (111.494 kg)  BMI 34.28 kg/m2  General: Well developed, well nourished, in no acute distress. Head:  Normocephalic and atraumatic. Neck: no JVD Lungs: Clear to auscultation and percussion. Heart: Normal S1 and S2.  No murmur, rubs or gallops.  MS well healed.  No lateral chest tenderness.   Pulses: Pulses normal in all 4 extremities. Extremities: No clubbing or cyanosis. No edema. Neurologic: Alert and oriented x 3.  EKG:  NSR.  Delay in R wave progression.  Inferior MI, old.  Compared to the last tracing, no significant change.   ASSESSMENT AND PLAN:

## 2012-01-08 NOTE — Assessment & Plan Note (Signed)
Reviewed with patient in detail.  Will recheck BMET in two weeks.

## 2012-01-08 NOTE — Assessment & Plan Note (Signed)
Have suggested he follow up with Dr. Tawanna Cooler.  Needs home monitoring.

## 2012-01-08 NOTE — Assessment & Plan Note (Signed)
Stable post CABG 

## 2012-01-09 ENCOUNTER — Encounter: Payer: Self-pay | Admitting: Surgery

## 2012-01-09 ENCOUNTER — Ambulatory Visit (INDEPENDENT_AMBULATORY_CARE_PROVIDER_SITE_OTHER): Payer: Self-pay | Admitting: Surgery

## 2012-01-09 VITALS — BP 127/94 | HR 64 | Resp 16 | Ht 71.0 in | Wt 245.0 lb

## 2012-01-09 DIAGNOSIS — I2510 Atherosclerotic heart disease of native coronary artery without angina pectoris: Secondary | ICD-10-CM

## 2012-01-09 DIAGNOSIS — Z09 Encounter for follow-up examination after completed treatment for conditions other than malignant neoplasm: Secondary | ICD-10-CM

## 2012-01-10 ENCOUNTER — Encounter: Payer: Self-pay | Admitting: Surgery

## 2012-01-10 NOTE — Progress Notes (Signed)
                   [address]            [place] [address]          [phone]     HPI:  Patient returns for routine postoperative follow-up having undergone coronary bypass graft surgery x5 on 12/13/2011. The patient's early postoperative recovery while in the hospital was notable for development of postoperative atrial fibrillation. He was converted and discharged on amiodarone. Since hospital discharge the patient reports he has been feeling well. He is anxious to resume cardiac rehabilitation.   Current Outpatient Prescriptions  Medication Sig Dispense Refill  . acyclovir (ZOVIRAX) 200 MG capsule Take 400 mg by mouth 3 (three) times daily as needed. For cold sores      . amiodarone (PACERONE) 200 MG tablet Take 1 tablet (200 mg total) by mouth daily.  30 tablet  6  . aspirin 81 MG tablet Take 81 mg by mouth daily.      Marland Kitchen atorvastatin (LIPITOR) 80 MG tablet Take 1 tablet (80 mg total) by mouth at bedtime.  30 tablet  2  . bisacodyl (DULCOLAX) 5 MG EC tablet Take 5 mg by mouth as needed.       . carvedilol (COREG) 6.25 MG tablet Take 1 tablet (6.25 mg total) by mouth 2 (two) times daily with a meal.  60 tablet  6  . diphenhydrAMINE (BENADRYL) 25 MG tablet Take 1 tablet (25 mg total) by mouth every 6 (six) hours. For three days, then as needed  20 tablet  0  . docusate sodium (COLACE) 100 MG capsule Take 100 mg by mouth as needed.       . metFORMIN (GLUCOPHAGE) 850 MG tablet Take 850 mg by mouth 2 (two) times daily with a meal.      . Multiple Vitamin (MULITIVITAMIN WITH MINERALS) TABS Take 1 tablet by mouth daily.        Physical Exam:  BP 127/94  Pulse 64  Resp 16  Ht 5\' 11"  (1.803 m)  Wt 245 lb (111.131 kg)  BMI 34.17 kg/m2  SpO2 98% He looks well. Cardiac exam shows regular rate and rhythm with normal heart sounds. Lungs clear. The chest incision is healing well and sternum is stable. His leg incision is healing well and there is no peripheral  edema.  Diagnostic Tests:  He did not go for his chest x-ray today  Impression:  Overall he appears to make a good recovery following his surgery. He assures me that he has continued to abstain from smoking cigars. 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 He can return to cardiac rehabilitation.  Plan:  He will continue to followup with Dr. Riley Kill and will contact me if he develops any problems with his incisions.

## 2012-01-24 ENCOUNTER — Other Ambulatory Visit (INDEPENDENT_AMBULATORY_CARE_PROVIDER_SITE_OTHER): Payer: Medicare Other

## 2012-01-24 DIAGNOSIS — I2510 Atherosclerotic heart disease of native coronary artery without angina pectoris: Secondary | ICD-10-CM

## 2012-01-24 LAB — BASIC METABOLIC PANEL
BUN: 19 mg/dL (ref 6–23)
Calcium: 9.3 mg/dL (ref 8.4–10.5)
Creatinine, Ser: 1 mg/dL (ref 0.4–1.5)
GFR: 83.29 mL/min (ref >60.00)
Glucose, Bld: 97 mg/dL (ref 70–99)
Sodium: 142 mEq/L (ref 135–145)

## 2012-01-26 ENCOUNTER — Other Ambulatory Visit: Payer: Self-pay | Admitting: Cardiology

## 2012-01-26 NOTE — Telephone Encounter (Signed)
  Patient would like a call back , when rx is called in

## 2012-01-31 ENCOUNTER — Other Ambulatory Visit: Payer: Self-pay

## 2012-01-31 MED ORDER — METFORMIN HCL 850 MG PO TABS: 850.0000 mg | ORAL_TABLET | Freq: Two times a day (BID) | ORAL | Status: DC

## 2012-02-07 ENCOUNTER — Encounter (HOSPITAL_COMMUNITY): Payer: Self-pay | Admitting: *Deleted

## 2012-02-14 ENCOUNTER — Encounter: Payer: Self-pay | Admitting: Cardiology

## 2012-02-14 ENCOUNTER — Ambulatory Visit (INDEPENDENT_AMBULATORY_CARE_PROVIDER_SITE_OTHER): Payer: Medicare Other | Admitting: Cardiology

## 2012-02-14 VITALS — BP 130/88 | HR 60 | Ht 71.0 in | Wt 250.8 lb

## 2012-02-14 DIAGNOSIS — I10 Essential (primary) hypertension: Secondary | ICD-10-CM

## 2012-02-14 DIAGNOSIS — E875 Hyperkalemia: Secondary | ICD-10-CM

## 2012-02-14 DIAGNOSIS — I4891 Unspecified atrial fibrillation: Secondary | ICD-10-CM

## 2012-02-14 DIAGNOSIS — E119 Type 2 diabetes mellitus without complications: Secondary | ICD-10-CM

## 2012-02-14 DIAGNOSIS — E785 Hyperlipidemia, unspecified: Secondary | ICD-10-CM

## 2012-02-14 LAB — BASIC METABOLIC PANEL
BUN: 20 mg/dL (ref 6–23)
Calcium: 9.6 mg/dL (ref 8.4–10.5)
Creatinine, Ser: 0.9 mg/dL (ref 0.4–1.5)
GFR: 89.72 mL/min (ref >60.00)

## 2012-02-14 MED ORDER — CARVEDILOL 12.5 MG PO TABS: 12.5000 mg | ORAL_TABLET | Freq: Two times a day (BID) | ORAL | Status: DC

## 2012-02-14 NOTE — Patient Instructions (Addendum)
Your physician recommends that you schedule a follow-up appointment in: ONE MONTH  STOP AMIODARONE  INCREASE CURRENT CARVEDILOL TO 1 AND 1/2 TABLETS TWICE DAILY X ONE WEEK THEN STOP CURRENT SCRIPT  AFTER ONE WEEK START CARVEDILOL 12.5 MG TWICE DAILY  Your physician recommends that you HAVE LAB WORK TODAY  Your physician recommends that you return for a FASTING lipid profile: WHEN ABLE

## 2012-02-14 NOTE — Assessment & Plan Note (Signed)
Will recheck fasting lipid and liver

## 2012-02-14 NOTE — Assessment & Plan Note (Signed)
Recheck BMET 

## 2012-02-14 NOTE — Assessment & Plan Note (Signed)
Will increase carvedilol and stop amiodarone.  With rate of 60 should be ok today do this.  Increase carvedilol to 9.75mg  bid and then to 12.5mg  BID in one week.  Return in one month. Stop amiodarone, so HR should be able to handle that.

## 2012-02-14 NOTE — Assessment & Plan Note (Signed)
Check sugars regularly.

## 2012-02-14 NOTE — Progress Notes (Signed)
HPI:  He is doing quite well. He's not having any major problems. He is able to exercise on a regular basis. He has a regular rhythm. We talked about stopping amiodarone and that is the plan today. He's not had chest pain, nor has he felt that his been out of rhythm. His most recent BMET was markedly improved.  No chest pain.    Current Outpatient Prescriptions  Medication Sig Dispense Refill  . acyclovir (ZOVIRAX) 200 MG capsule Take 400 mg by mouth 3 (three) times daily as needed. For cold sores      . amiodarone (PACERONE) 200 MG tablet Take 1 tablet (200 mg total) by mouth daily.  30 tablet  6  . aspirin 81 MG tablet Take 81 mg by mouth daily.      Marland Kitchen atorvastatin (LIPITOR) 80 MG tablet Take 1 tablet (80 mg total) by mouth at bedtime.  30 tablet  2  . bisacodyl (DULCOLAX) 5 MG EC tablet Take 5 mg by mouth as needed.       . carvedilol (COREG) 6.25 MG tablet Take 1 tablet (6.25 mg total) by mouth 2 (two) times daily with a meal.  60 tablet  6  . docusate sodium (COLACE) 100 MG capsule Take 100 mg by mouth as needed.       . metFORMIN (GLUCOPHAGE) 850 MG tablet Take 1 tablet (850 mg total) by mouth 2 (two) times daily with a meal.  180 tablet  0  . Multiple Vitamin (MULITIVITAMIN WITH MINERALS) TABS Take 1 tablet by mouth daily.      . diphenhydrAMINE (BENADRYL) 25 MG tablet Take 1 tablet (25 mg total) by mouth every 6 (six) hours. For three days, then as needed  20 tablet  0    Allergies  Allergen Reactions  . Lisinopril Swelling    Swollen tongue   . Penicillins Hives and Rash    Past Medical History  Diagnosis Date  . HTN (hypertension)   . Diabetes mellitus type 2, noninsulin dependent     a. diagnosed 2012  . Coronary artery disease   . Myocardial infarction   . Diabetes mellitus   . GERD (gastroesophageal reflux disease)   . Atrial fibrillation 12/28/2011  . 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 TOBACCO ABUSE 04/14/2008    Past Surgical History  Procedure Date  .  Cardiac catheterization     12-01-2011  . Left arm surgery as a child   . Coronary artery bypass graft 12/13/2011    Procedure: CORONARY ARTERY BYPASS GRAFTING (CABG);  Surgeon: Alleen Borne, MD;  Location: [facility] OR;  Service: Open Heart Surgery;  Laterality: N/A;  Times five, on pump, using endoscopically harvested right greater saphenous vein and left internal mammary artery.     Family History  Problem Relation Age of Onset  . Heart attack Father     died @ 82's  . Heart attack Mother     died @ 61's  . Heart attack Brother     died @ 76  . Heart attack Brother     died @ 8  . Anesthesia problems Neg Hx   . Hypotension Neg Hx   . Malignant hyperthermia Neg Hx   . Pseudochol deficiency Neg Hx     History   Social History  . Marital Status: Widowed    Spouse Name: N/A    Number of Children: N/A  . Years of Education: N/A   Occupational History  . Not on file.  Social History Main Topics  . Smoking status: Former Smoker -- 2 years    Types: Cigars  . Smokeless tobacco: Not on file   Comment: smokes 1 cigar/day for many years  . Alcohol Use: No  . Drug Use: No  . Sexually Active: Not Currently   Other Topics Concern  . Not on file   Social History Narrative   Pt lives by himself in [place].  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  He does not routinely exercise or adhere to any particular diet.    ROS: Please see the HPI.  All other systems reviewed and negative.  PHYSICAL EXAM:  BP 130/88  Pulse 60  Ht 5\' 11"  (1.803 m)  Wt 250 lb 12.8 oz (113.762 kg)  BMI 34.98 kg/m2  General: Well developed, well nourished, in no acute distress. Head:  Normocephalic and atraumatic. Neck: no JVD Lungs: Clear to auscultation and percussion. Heart: Normal S1 and S2.  No murmur, rubs or gallops.  Pulses: Pulses normal in all 4 extremities. Extremities: No clubbing or cyanosis. No edema. Neurologic: Alert and oriented x 3.  EKG:  Not done today.    ASSESSMENT AND PLAN:

## 2012-02-14 NOTE — Assessment & Plan Note (Signed)
Hopefully will not recur with dc of Amio.

## 2012-02-15 ENCOUNTER — Encounter (HOSPITAL_COMMUNITY)
Admission: RE | Admit: 2012-02-15 | Discharge: 2012-02-15 | Disposition: A | Discharge status: Home / Self Care | Payer: Medicare Other | Source: Ambulatory Visit | Attending: Cardiology | Admitting: Cardiology

## 2012-02-15 ENCOUNTER — Other Ambulatory Visit (INDEPENDENT_AMBULATORY_CARE_PROVIDER_SITE_OTHER): Payer: Medicare Other

## 2012-02-15 DIAGNOSIS — E119 Type 2 diabetes mellitus without complications: Secondary | ICD-10-CM | POA: Insufficient documentation

## 2012-02-15 DIAGNOSIS — Z5189 Encounter for other specified aftercare: Secondary | ICD-10-CM | POA: Insufficient documentation

## 2012-02-15 DIAGNOSIS — I10 Essential (primary) hypertension: Secondary | ICD-10-CM | POA: Insufficient documentation

## 2012-02-15 DIAGNOSIS — I252 Old myocardial infarction: Secondary | ICD-10-CM | POA: Insufficient documentation

## 2012-02-15 DIAGNOSIS — E669 Obesity, unspecified: Secondary | ICD-10-CM | POA: Insufficient documentation

## 2012-02-15 DIAGNOSIS — E785 Hyperlipidemia, unspecified: Secondary | ICD-10-CM

## 2012-02-15 DIAGNOSIS — F17200 Nicotine dependence, unspecified, uncomplicated: Secondary | ICD-10-CM | POA: Insufficient documentation

## 2012-02-15 DIAGNOSIS — K219 Gastro-esophageal reflux disease without esophagitis: Secondary | ICD-10-CM | POA: Insufficient documentation

## 2012-02-15 DIAGNOSIS — I2510 Atherosclerotic heart disease of native coronary artery without angina pectoris: Secondary | ICD-10-CM | POA: Insufficient documentation

## 2012-02-15 DIAGNOSIS — Z9861 Coronary angioplasty status: Secondary | ICD-10-CM | POA: Insufficient documentation

## 2012-02-15 LAB — HEPATIC FUNCTION PANEL
AST: 14 U/L (ref 0–37)
Albumin: 3.9 g/dL (ref 3.5–5.2)
Alkaline Phosphatase: 55 U/L (ref 39–117)
Bilirubin, Direct: 0.1 mg/dL (ref 0.0–0.3)
Total Protein: 6.5 g/dL (ref 6.0–8.3)

## 2012-02-15 LAB — LIPID PANEL
Cholesterol: 97 mg/dL (ref 0–200)
Triglycerides: 65 mg/dL (ref 0.0–149.0)

## 2012-02-15 NOTE — Progress Notes (Signed)
Cardiac Rehab Medication Review by a Pharmacist  Does the patient  feel that his/her medications are working for him/her?  yes  Has the patient been experiencing any side effects to the medications prescribed?  no  Does the patient measure his/her own blood pressure or blood glucose at home?  no   Does the patient have any problems obtaining medications due to transportation or finances?   no  Understanding of regimen: good Understanding of indications: good Potential of compliance: good    Pharmacist comments: Patient is doing well, but wants to get back to lifting for his job. He does not measure his blood pressure or glucose at home but says he should start to do this.     Janace Litten 02/15/2012 8:42 AM

## 2012-02-19 ENCOUNTER — Encounter (HOSPITAL_COMMUNITY)
Admission: RE | Admit: 2012-02-19 | Discharge: 2012-02-19 | Disposition: A | Discharge status: Home / Self Care | Payer: Medicare Other | Source: Ambulatory Visit | Attending: Cardiology | Admitting: Cardiology

## 2012-02-19 ENCOUNTER — Encounter (HOSPITAL_COMMUNITY): Payer: Self-pay

## 2012-02-19 NOTE — Progress Notes (Signed)
Brief Nutrition Note Lab Results  Component Value Date   HGBA1C 6.0* 12/11/2011   RN consult for CBG's too low to exercise on first day of Cardiac Rehab. Pre-exercise CBG 79 mg/dL. Pt reports he ate a lunch of grilled chicken salad around 11-11:15 am and a "low-carb" protein bar for breakfast. Pt states he has lost 40 lb on a "low carb diet" over the past 8 months. Pt denies previous DM education. Importance of eating healthy complex carbs discussed. Pt interested in DM education classes offered through Cardiac Rehab. Pt does not check his CBG's at home. Per pt, pt taking 850 mg Metformin once daily prior to hospitalization. Pt discharged on Metformin 850 mg BID. Pt willing to try holding am Metformin. Pt states he has not previously checked his CBG's regularly because "it takes time and it's a pain." Pt owns a glucometer and agreed to check and keep a log of his CBG's at least 4 mornings/week. Pt reports he does not currently have a PCP. Pt previously saw Dr. Tawanna Cooler and would like to reestablish care with him. Will fax pt CBG's and recommendations when CBG's available. Pt expressed understanding of above information. Deveron Furlong, RN made of aware of recommendations. Continue client-centered nutrition education by RD as part of interdisciplinary care.  Monitor and evaluate progress toward nutrition goal with team.

## 2012-02-19 NOTE — Progress Notes (Signed)
Pt BP-166/90 (left arm) upon arrival to Cardiac Rehab. Recheck after sitting 160/90 (L arm) 148/88 (R arm).  Pt CBG-79.  Pt did not exercise today. Pt states he is currently taking coreg 9.75mg  BID and will increase to 12.5mg  BID this week per Dr. Riley Kill order.  Pt states he did have increased sodium intake yesterday.  Pt reports he does not routinely check CBG at home.  Pt states he did eat lunch at 11:30today -grilled chicken salad.  Pt met with Mickle Plumb, RD, Diabetes Educator who advised pt to hold AM Metformin dose, check CBG fasting 4X weekly, increase carbohydrate intake especially prior to exercise.  Written and verbal instructions given. Pt verbalized understanding

## 2012-02-21 ENCOUNTER — Encounter (HOSPITAL_COMMUNITY)
Admission: RE | Admit: 2012-02-21 | Discharge: 2012-02-21 | Disposition: A | Discharge status: Home / Self Care | Payer: Medicare Other | Source: Ambulatory Visit | Attending: Cardiology | Admitting: Cardiology

## 2012-02-21 NOTE — Progress Notes (Addendum)
Pt returned rehab today.  Pt tolerated light exercise without difficulty. Monitor showed SR with first degree AV block.  Pt pre blood sugar 102, pt given lemonade as a precaution.  Pt tolerated light exercised and bp remained wnl. For exercise.  Pt post blood sugar was 84.  Pt given snack of peanut butter, crackers and lemonade given. Pt to have recheck after he attends 15 minute education class.   Consult with Dietician.  Pt recheck 137.

## 2012-02-23 ENCOUNTER — Encounter (HOSPITAL_COMMUNITY)
Admission: RE | Admit: 2012-02-23 | Discharge: 2012-02-23 | Disposition: A | Discharge status: Home / Self Care | Payer: Medicare Other | Source: Ambulatory Visit | Attending: Cardiology | Admitting: Cardiology

## 2012-02-23 LAB — GLUCOSE, CAPILLARY
Glucose-Capillary: 136 mg/dL — ABNORMAL HIGH (ref 70–99)
Glucose-Capillary: 87 mg/dL (ref 70–99)
Glucose-Capillary: 134 mg/dL — ABNORMAL HIGH (ref 70–99)

## 2012-02-26 ENCOUNTER — Encounter (HOSPITAL_COMMUNITY)
Admission: RE | Admit: 2012-02-26 | Discharge: 2012-02-26 | Disposition: A | Discharge status: Home / Self Care | Payer: Medicare Other | Source: Ambulatory Visit | Attending: Cardiology | Admitting: Cardiology

## 2012-02-26 LAB — GLUCOSE, CAPILLARY
Glucose-Capillary: 150 mg/dL — ABNORMAL HIGH (ref 70–99)
Glucose-Capillary: 119 mg/dL — ABNORMAL HIGH (ref 70–99)

## 2012-02-28 ENCOUNTER — Encounter (HOSPITAL_COMMUNITY)
Admission: RE | Admit: 2012-02-28 | Discharge: 2012-02-28 | Disposition: A | Discharge status: Home / Self Care | Payer: Medicare Other | Source: Ambulatory Visit | Attending: Cardiology | Admitting: Cardiology

## 2012-02-28 NOTE — Progress Notes (Signed)
Brief Nutrition Note Pt fasting CBG's over the past 9 days reviewed. Morning CBG's 96-124 mg/dL, which is WNL. Spoke with Deveron Furlong, RN re: CBG's. RN is helping pt become reestablished with Dr. Tawanna Cooler and will then fax CBG's to Dr. Nelida Meuse office. Pt is interested in decreasing evening dose of Metformin, which this writer discouraged pt from doing at this time. Pt encouraged to continue with diet, exercise, and wt loss and we will monitor CBG's/need to recommend further changes to pt's DM medication regimen. Pt encouraged to reestablish Dr. Tawanna Cooler as his PCP and discuss pt's desire to wean off of Metformin. Pt expressed understanding.  Continue client-centered nutrition education by RD as part of interdisciplinary care.  Monitor and evaluate progress toward nutrition goal with team.

## 2012-03-01 ENCOUNTER — Encounter (HOSPITAL_COMMUNITY)
Admission: RE | Admit: 2012-03-01 | Discharge: 2012-03-01 | Disposition: A | Discharge status: Home / Self Care | Payer: Medicare Other | Source: Ambulatory Visit | Attending: Cardiology | Admitting: Cardiology

## 2012-03-01 LAB — GLUCOSE, CAPILLARY: Glucose-Capillary: 120 mg/dL — ABNORMAL HIGH (ref 70–99)

## 2012-03-01 NOTE — Progress Notes (Signed)
(  late entry)  Phone call to Dr. Tinnie Gens Todd's office to request appt for pt to reestablish primary care.  Pt has not been seen by Dr. Tawanna Cooler in approx. 5 years, his office staff will confirm appropriate for pt to continue to f/u with Dr Tawanna Cooler as he is currently not accepting new medicare patients.   If Dr. Tawanna Cooler is not able to continue pt primary care needs, pt states he is accepting to establish with another MD in [facility] office.

## 2012-03-04 ENCOUNTER — Encounter (HOSPITAL_COMMUNITY)
Admission: RE | Admit: 2012-03-04 | Discharge: 2012-03-04 | Disposition: A | Discharge status: Home / Self Care | Payer: Medicare Other | Source: Ambulatory Visit | Attending: Cardiology | Admitting: Cardiology

## 2012-03-04 DIAGNOSIS — Z9861 Coronary angioplasty status: Secondary | ICD-10-CM | POA: Insufficient documentation

## 2012-03-04 DIAGNOSIS — I252 Old myocardial infarction: Secondary | ICD-10-CM | POA: Insufficient documentation

## 2012-03-04 DIAGNOSIS — I10 Essential (primary) hypertension: Secondary | ICD-10-CM | POA: Insufficient documentation

## 2012-03-04 DIAGNOSIS — K219 Gastro-esophageal reflux disease without esophagitis: Secondary | ICD-10-CM | POA: Insufficient documentation

## 2012-03-04 DIAGNOSIS — E669 Obesity, unspecified: Secondary | ICD-10-CM | POA: Insufficient documentation

## 2012-03-04 DIAGNOSIS — F17200 Nicotine dependence, unspecified, uncomplicated: Secondary | ICD-10-CM | POA: Insufficient documentation

## 2012-03-04 DIAGNOSIS — E119 Type 2 diabetes mellitus without complications: Secondary | ICD-10-CM | POA: Insufficient documentation

## 2012-03-04 DIAGNOSIS — I2510 Atherosclerotic heart disease of native coronary artery without angina pectoris: Secondary | ICD-10-CM | POA: Insufficient documentation

## 2012-03-04 DIAGNOSIS — Z5189 Encounter for other specified aftercare: Secondary | ICD-10-CM | POA: Insufficient documentation

## 2012-03-04 LAB — GLUCOSE, CAPILLARY: Glucose-Capillary: 85 mg/dL (ref 70–99)

## 2012-03-06 ENCOUNTER — Encounter (HOSPITAL_COMMUNITY): Payer: Medicare Other

## 2012-03-06 ENCOUNTER — Other Ambulatory Visit (HOSPITAL_COMMUNITY): Payer: Self-pay | Admitting: Cardiology

## 2012-03-08 ENCOUNTER — Encounter (HOSPITAL_COMMUNITY): Payer: Medicare Other

## 2012-03-11 ENCOUNTER — Encounter (HOSPITAL_COMMUNITY)
Admission: RE | Admit: 2012-03-11 | Discharge: 2012-03-11 | Disposition: A | Discharge status: Home / Self Care | Payer: Medicare Other | Source: Ambulatory Visit | Attending: Cardiology | Admitting: Cardiology

## 2012-03-11 ENCOUNTER — Other Ambulatory Visit (HOSPITAL_COMMUNITY): Payer: Self-pay | Admitting: Cardiology

## 2012-03-11 LAB — GLUCOSE, CAPILLARY
Glucose-Capillary: 109 mg/dL — ABNORMAL HIGH (ref 70–99)
Glucose-Capillary: 102 mg/dL — ABNORMAL HIGH (ref 70–99)

## 2012-03-13 ENCOUNTER — Telehealth (HOSPITAL_COMMUNITY): Payer: Self-pay | Admitting: Cardiac Rehabilitation

## 2012-03-13 ENCOUNTER — Encounter (HOSPITAL_COMMUNITY): Payer: Medicare Other

## 2012-03-15 ENCOUNTER — Ambulatory Visit: Payer: Medicare Other | Admitting: Family

## 2012-03-15 ENCOUNTER — Encounter (HOSPITAL_COMMUNITY)
Admission: RE | Admit: 2012-03-15 | Discharge: 2012-03-15 | Disposition: A | Discharge status: Home / Self Care | Payer: Medicare Other | Source: Ambulatory Visit | Attending: Cardiology | Admitting: Cardiology

## 2012-03-15 LAB — GLUCOSE, CAPILLARY: Glucose-Capillary: 104 mg/dL — ABNORMAL HIGH (ref 70–99)

## 2012-03-18 ENCOUNTER — Ambulatory Visit (INDEPENDENT_AMBULATORY_CARE_PROVIDER_SITE_OTHER): Payer: Medicare Other | Admitting: Cardiology

## 2012-03-18 ENCOUNTER — Encounter (HOSPITAL_COMMUNITY)
Admission: RE | Admit: 2012-03-18 | Discharge: 2012-03-18 | Disposition: A | Discharge status: Home / Self Care | Payer: Medicare Other | Source: Ambulatory Visit | Attending: Cardiology | Admitting: Cardiology

## 2012-03-18 ENCOUNTER — Encounter: Payer: Self-pay | Admitting: Cardiology

## 2012-03-18 VITALS — BP 128/78 | HR 63 | Ht 71.0 in | Wt 256.0 lb

## 2012-03-18 DIAGNOSIS — I10 Essential (primary) hypertension: Secondary | ICD-10-CM

## 2012-03-18 DIAGNOSIS — I4891 Unspecified atrial fibrillation: Secondary | ICD-10-CM

## 2012-03-18 DIAGNOSIS — I2510 Atherosclerotic heart disease of native coronary artery without angina pectoris: Secondary | ICD-10-CM

## 2012-03-18 DIAGNOSIS — E785 Hyperlipidemia, unspecified: Secondary | ICD-10-CM

## 2012-03-18 MED ORDER — ATORVASTATIN CALCIUM 40 MG PO TABS: 40.0000 mg | ORAL_TABLET | Freq: Every day | ORAL | Status: DC

## 2012-03-18 NOTE — Assessment & Plan Note (Signed)
Stable post bypass.  Continue medical therapy.

## 2012-03-18 NOTE — Assessment & Plan Note (Signed)
Patient numbers are outstanding.  Will drop atorvastatin to 40mg  and reassess in six weeks.  RTC in three months.

## 2012-03-18 NOTE — Patient Instructions (Addendum)
You are scheduled to see Dr. Riley Kill on June 19, 2012  Your physician recommends that you return for lab work on April 29, 2012.  To be done at [facility] office.  Your physician has recommended you make the following change in your medication: Decrease Lipitor to 40 mg by mouth daily

## 2012-03-18 NOTE — Progress Notes (Signed)
HPI:  The patient is doing well in the rehabilitation program. He was concerned about his BUN to creatinine ratio, discussed this in detail with him. He's not having any major symptoms, and he feels like he is doing really well in the rehabilitation program. He is getting sugars checked frequently.  Current Outpatient Prescriptions  Medication Sig Dispense Refill  . acyclovir (ZOVIRAX) 200 MG capsule Take 400 mg by mouth 3 (three) times daily as needed. For cold sores      . aspirin 81 MG tablet Take 81 mg by mouth daily.      Marland Kitchen atorvastatin (LIPITOR) 40 MG tablet Take 1 tablet (40 mg total) by mouth daily.  30 tablet  3  . carvedilol (COREG) 12.5 MG tablet Take 1 tablet (12.5 mg total) by mouth 2 (two) times daily with a meal.  60 tablet  6  . metFORMIN (GLUCOPHAGE) 850 MG tablet Take 850 mg by mouth daily with breakfast.      . Multiple Vitamin (MULITIVITAMIN WITH MINERALS) TABS Take 1 tablet by mouth daily.      Marland Kitchen DISCONTD: atorvastatin (LIPITOR) 80 MG tablet TAKE ONE TABLET BY MOUTH AT BEDTIME  30 tablet  3  . diphenhydrAMINE (BENADRYL) 25 MG tablet Take 1 tablet (25 mg total) by mouth every 6 (six) hours. For three days, then as needed  20 tablet  0  . DISCONTD: atorvastatin (LIPITOR) 80 MG tablet TAKE ONE TABLET BY MOUTH AT BEDTIME  30 tablet  6    Allergies  Allergen Reactions  . Lisinopril Swelling    Swollen tongue   . Penicillins Hives and Rash    Past Medical History  Diagnosis Date  . HTN (hypertension)   . Diabetes mellitus type 2, noninsulin dependent     a. diagnosed 2012  . Coronary artery disease   . Myocardial infarction   . Diabetes mellitus   . GERD (gastroesophageal reflux disease)   . Atrial fibrillation 12/28/2011  . 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 TOBACCO ABUSE 04/14/2008    Past Surgical History  Procedure Date  . Cardiac catheterization     12-01-2011  . Left arm surgery as a child   . Coronary artery bypass graft 12/13/2011    Procedure:  CORONARY ARTERY BYPASS GRAFTING (CABG);  Surgeon: Alleen Borne, MD;  Location: [facility] OR;  Service: Open Heart Surgery;  Laterality: N/A;  Times five, on pump, using endoscopically harvested right greater saphenous vein and left internal mammary artery.     Family History  Problem Relation Age of Onset  . Heart attack Father     died @ 18's  . Heart attack Mother     died @ 47's  . Heart attack Brother     died @ 56  . Heart attack Brother     died @ 54  . Anesthesia problems Neg Hx   . Hypotension Neg Hx   . Malignant hyperthermia Neg Hx   . Pseudochol deficiency Neg Hx     History   Social History  . Marital Status: Widowed    Spouse Name: N/A    Number of Children: N/A  . Years of Education: N/A   Occupational History  . Not on file.   Social History Main Topics  . Smoking status: Former Smoker -- 2 years    Types: Cigars  . Smokeless tobacco: Not on file   Comment: smokes 1 cigar/day for many years  . Alcohol Use: No  . Drug Use:  No  . Sexually Active: Not Currently   Other Topics Concern  . Not on file   Social History Narrative   Pt lives by himself in [place].  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  He does not routinely exercise or adhere to any particular diet.    ROS: Please see the HPI.  All other systems reviewed and negative.  PHYSICAL EXAM:  BP 128/78  Pulse 63  Ht 5\' 11"  (1.803 m)  Wt 256 lb (116.121 kg)  BMI 35.70 kg/m2  SpO2 98%  General: Well developed, well nourished, in no acute distress. Head:  Normocephalic and atraumatic. Neck: no JVD Lungs: Clear to auscultation and percussion. Heart: Normal S1 and S2.  No murmur, rubs or gallops.  Pulses: Pulses normal in all 4 extremities. Extremities: No clubbing or cyanosis. No edema. Neurologic: Alert and oriented x 3.  EKG:  NSR.  Inferior MI, old.  Delay in R wave progression.   ASSESSMENT AND PLAN:

## 2012-03-18 NOTE — Progress Notes (Addendum)
Nutrition Note. Spoke with pt. Pt diabetes education test reviewed. Pt expressed understanding. Per discussion, pt states wt loss "has stalled." Pt interested in 5-day menu ideas. Pt given 1800 kcal, 5-day menu idea handout. New lipids compared to previous lipids reviewed with pt. Continue client-centered nutrition education by RD as part of interdisciplinary care.  Monitor and evaluate progress toward nutrition goal with team.  Lipid Panel     Component Value Date/Time   CHOL 97 02/15/2012 0737   TRIG 65.0 02/15/2012 0737   HDL 57.30 02/15/2012 0737   CHOLHDL 2 02/15/2012 0737   VLDL 13.0 02/15/2012 0737   LDLCALC 27 02/15/2012 0737

## 2012-03-18 NOTE — Assessment & Plan Note (Signed)
Controlled.  

## 2012-03-18 NOTE — Assessment & Plan Note (Signed)
No recurrence off of amiodarone.

## 2012-03-20 ENCOUNTER — Encounter (HOSPITAL_COMMUNITY)
Admission: RE | Admit: 2012-03-20 | Discharge: 2012-03-20 | Disposition: A | Discharge status: Home / Self Care | Payer: Medicare Other | Source: Ambulatory Visit | Attending: Cardiology | Admitting: Cardiology

## 2012-03-20 LAB — GLUCOSE, CAPILLARY: Glucose-Capillary: 100 mg/dL — ABNORMAL HIGH (ref 70–99)

## 2012-03-20 NOTE — Progress Notes (Signed)
Nutrition Note Spoke with pt and Gladstone Lighter, RN. Pt CBG's dropping 30-60 mg/dL with exercise. Pt currently taking 850 mg Metformin at night daily. Pt reports fasting CBG's 98-110 mg/dL. Fasting CBG's WNL. Pt has an appt with his PCP Dr. Tawanna Cooler tomorrow. Pt encouraged to discuss decreasing Metformin further with his MD. Last A1c noted to be WNL. If pt's updated A1c < 6.5, feel pt may benefit from halving his Metformin. Continue client-centered nutrition education by RD as part of interdisciplinary care.  Monitor and evaluate progress toward nutrition goal with team.   Lab Results  Component Value Date   HGBA1C 6.0* 12/11/2011

## 2012-03-21 ENCOUNTER — Encounter: Payer: Self-pay | Admitting: Family

## 2012-03-21 ENCOUNTER — Ambulatory Visit (INDEPENDENT_AMBULATORY_CARE_PROVIDER_SITE_OTHER): Payer: Medicare Other | Admitting: Family

## 2012-03-21 VITALS — BP 130/80 | Temp 98.6°F | Ht 71.0 in | Wt 256.0 lb

## 2012-03-21 DIAGNOSIS — Z125 Encounter for screening for malignant neoplasm of prostate: Secondary | ICD-10-CM

## 2012-03-21 DIAGNOSIS — Z951 Presence of aortocoronary bypass graft: Secondary | ICD-10-CM

## 2012-03-21 DIAGNOSIS — E119 Type 2 diabetes mellitus without complications: Secondary | ICD-10-CM

## 2012-03-21 DIAGNOSIS — E7800 Pure hypercholesterolemia, unspecified: Secondary | ICD-10-CM

## 2012-03-21 DIAGNOSIS — I2510 Atherosclerotic heart disease of native coronary artery without angina pectoris: Secondary | ICD-10-CM

## 2012-03-21 MED ORDER — METFORMIN HCL 500 MG PO TABS: 500.0000 mg | ORAL_TABLET | Freq: Every day | ORAL | Status: DC

## 2012-03-21 NOTE — Progress Notes (Signed)
Subjective:    Patient ID: Daniel [name], male    DOB: 1946/07/12, 66 y.o.   MRN: [id number]  HPI 66 year old white male, nonsmoker is in to be established. He has a history of type 2 diabetes, hypertension, hyperlipidemia, coronary artery disease. 12/01/2011 patient had a myocardial infarction. Thereafter, he underwent a 5 vessel stent placement 12/13/2011. He is in cardiac rehabilitation and doing well. He has a history of type 2 diabetes. Last A1c was 6.2. Currently takes metformin 500 mg once daily. He has 2 brothers deceased related to myocardial infarction at ages 23 and 48. He has another brother who had stent placement at age 58 x3 vessels who is alive and well at current age of 16.    Review of Systems  Constitutional: Negative.   HENT: Negative.   Eyes: Negative.   Respiratory: Negative.   Cardiovascular: Negative.  Negative for chest pain, palpitations and leg swelling.  Gastrointestinal: Negative.   Genitourinary: Negative.  Negative for urgency and frequency.  Musculoskeletal: Negative.   Skin: Negative.   Neurological: Negative.   Hematological: Negative.   Psychiatric/Behavioral: Negative.    Past Medical History  Diagnosis Date  . HTN (hypertension)   . Diabetes mellitus type 2, noninsulin dependent     a. diagnosed 2012  . Coronary artery disease   . Myocardial infarction   . Diabetes mellitus   . GERD (gastroesophageal reflux disease)   . Atrial fibrillation 12/28/2011  . 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 TOBACCO ABUSE 04/14/2008    History   Social History  . Marital Status: Widowed    Spouse Name: N/A    Number of Children: N/A  . Years of Education: N/A   Occupational History  . Not on file.   Social History Main Topics  . Smoking status: Former Smoker -- 2 years    Types: Cigars  . Smokeless tobacco: Not on file   Comment: smokes 1 cigar/day for many years  . Alcohol Use: No  . Drug Use: No  . Sexually Active: Not Currently    Other Topics Concern  . Not on file   Social History Narrative   Pt lives by himself in [place].  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  He does not routinely exercise or adhere to any particular diet.    Past Surgical History  Procedure Date  . Cardiac catheterization     12-01-2011  . Left arm surgery as a child   . Coronary artery bypass graft 12/13/2011    Procedure: CORONARY ARTERY BYPASS GRAFTING (CABG);  Surgeon: Alleen Borne, MD;  Location: [facility] OR;  Service: Open Heart Surgery;  Laterality: N/A;  Times five, on pump, using endoscopically harvested right greater saphenous vein and left internal mammary artery.     Family History  Problem Relation Age of Onset  . Heart attack Father     died @ 73's  . Heart attack Mother     died @ 58's  . Heart attack Brother     died @ 14  . Heart attack Brother     died @ 78  . Anesthesia problems Neg Hx   . Hypotension Neg Hx   . Malignant hyperthermia Neg Hx   . Pseudochol deficiency Neg Hx     Allergies  Allergen Reactions  . Lisinopril Swelling    Swollen tongue   . Penicillins Hives and Rash    Current Outpatient Prescriptions on File Prior to Visit  Medication Sig Dispense Refill  . acyclovir (  ZOVIRAX) 200 MG capsule Take 400 mg by mouth 3 (three) times daily as needed. For cold sores      . aspirin 81 MG tablet Take 81 mg by mouth daily.      Marland Kitchen atorvastatin (LIPITOR) 40 MG tablet Take 1 tablet (40 mg total) by mouth daily.  30 tablet  3  . carvedilol (COREG) 12.5 MG tablet Take 1 tablet (12.5 mg total) by mouth 2 (two) times daily with a meal.  60 tablet  6  . Multiple Vitamin (MULITIVITAMIN WITH MINERALS) TABS Take 1 tablet by mouth daily.      . diphenhydrAMINE (BENADRYL) 25 MG tablet Take 1 tablet (25 mg total) by mouth every 6 (six) hours. For three days, then as needed  20 tablet  0    BP 130/80  Temp 98.6 F (37 C) (Oral)  Ht 5\' 11"  (1.803 m)  Wt 256 lb (116.121 kg)  BMI 35.70 kg/m2chart    Objective:   Physical Exam   Constitutional: He is oriented to person, place, and time. He appears well-developed and well-nourished.  HENT:  Right Ear: External ear normal.  Left Ear: External ear normal.  Nose: Nose normal.  Mouth/Throat: Oropharynx is clear and moist.  Eyes: Conjunctivae are normal. Pupils are equal, round, and reactive to light.  Neck: Normal range of motion. Neck supple.  Cardiovascular: Normal rate, regular rhythm and normal heart sounds.   Pulmonary/Chest: Effort normal.  Abdominal: Soft. Bowel sounds are normal.  Musculoskeletal: Normal range of motion.  Neurological: He is alert and oriented to person, place, and time. He has normal reflexes.       Monofilament intact  Skin: Skin is warm and dry.  Psychiatric: He has a normal mood and affect.          Assessment & Plan:  Assessment: Type 2 Diabetes, CAD, CAGB with 5 vessel stent, Hypercholesterolemia  Plan: Refilled Metformin. Order for PSA to be drawn with fasting blood work at next lab draw appointment. Encouraged healthy diet and exercise. Continue to assess blood sugars. Recheck in 3 months and sooner as needed. Zostavax prescription given to take to the pharmacy.

## 2012-03-21 NOTE — Patient Instructions (Signed)
Diabetes and Exercise Regular exercise is important and can help:   Control blood glucose (sugar).   Decrease blood pressure.    Control blood lipids (cholesterol, triglycerides).   Improve overall health.  BENEFITS FROM EXERCISE  Improved fitness.   Improved flexibility.   Improved endurance.   Increased bone density.   Weight control.   Increased muscle strength.   Decreased body fat.   Improvement of the body's use of insulin, a hormone.   Increased insulin sensitivity.   Reduction of insulin needs.   Reduced stress and tension.   Helps you feel better.  People with diabetes who add exercise to their lifestyle gain additional benefits, including:  Weight loss.   Reduced appetite.   Improvement of the body's use of blood glucose.   Decreased risk factors for heart disease:   Lowering of cholesterol and triglycerides.   Raising the level of good cholesterol (high-density lipoproteins, HDL).   Lowering blood sugar.   Decreased blood pressure.  TYPE 1 DIABETES AND EXERCISE  Exercise will usually lower your blood glucose.   If blood glucose is greater than 240 mg/dl, check urine ketones. If ketones are present, do not exercise.   Location of the insulin injection sites may need to be adjusted with exercise. Avoid injecting insulin into areas of the body that will be exercised. For example, avoid injecting insulin into:   The arms when playing tennis.   The legs when jogging. For more information, discuss this with your caregiver.   Keep a record of:   Food intake.   Type and amount of exercise.   Expected peak times of insulin action.   Blood glucose levels.  Do this before, during, and after exercise. Review your records with your caregiver. This will help you to develop guidelines for adjusting food intake and insulin amounts.  TYPE 2 DIABETES AND EXERCISE  Regular physical activity can help control blood glucose.   Exercise is important  because it may:   Increase the body's sensitivity to insulin.   Improve blood glucose control.   Exercise reduces the risk of heart disease. It decreases serum cholesterol and triglycerides. It also lowers blood pressure.   Those who take insulin or oral hypoglycemic agents should watch for signs of hypoglycemia. These signs include dizziness, shaking, sweating, chills, and confusion.   Body water is lost during exercise. It must be replaced. This will help to avoid loss of body fluids (dehydration) or heat stroke.  Be sure to talk to your caregiver before starting an exercise program to make sure it is safe for you. Remember, any activity is better than none.  Document Released: 11/11/2003 Document Revised: 08/10/2011 Document Reviewed: 02/25/2009 Exit[employer] Patient Information 2012 [employer].   Diabetes Meal Planning Guide The diabetes meal planning guide is a tool to help you plan your meals and snacks. It is important for people with diabetes to manage their blood glucose (sugar) levels. Choosing the right foods and the right amounts throughout your day will help control your blood glucose. Eating right can even help you improve your blood pressure and reach or maintain a healthy weight. CARBOHYDRATE COUNTING MADE EASY When you eat carbohydrates, they turn to sugar. This raises your blood glucose level. Counting carbohydrates can help you control this level so you feel better. When you plan your meals by counting carbohydrates, you can have more flexibility in what you eat and balance your medicine with your food intake. Carbohydrate counting simply means adding up the total   amount of carbohydrate grams in your meals and snacks. Try to eat about the same amount at each meal. Foods with carbohydrates are listed below. Each portion below is 1 carbohydrate serving or 15 grams of carbohydrates. Ask your dietician how many grams of carbohydrates you should eat at each meal or snack. Grains  and Starches 1 slice bread.   English muffin or hotdog/hamburger bun.   cup cold cereal (unsweetened).  ? cup cooked pasta or rice.   cup starchy vegetables (corn, potatoes, peas, beans, winter squash).  1 tortilla (6 inches).   bagel.  1 waffle or pancake (size of a CD).   cup cooked cereal.  4 to 6 small crackers.  *Whole grain is recommended. Fruit 1 cup fresh unsweetened berries, melon, papaya, pineapple.  1 small fresh fruit.   banana or mango.   cup fruit juice (4 oz unsweetened).   cup canned fruit in natural juice or water.  2 tbs dried fruit.  12 to 15 grapes or cherries.  Milk and Yogurt 1 cup fat-free or 1% milk.  1 cup soy milk.  6 oz light yogurt with sugar-free sweetener.  6 oz low-fat soy yogurt.  6 oz plain yogurt.  Vegetables 1 cup raw or  cup cooked is counted as 0 carbohydrates or a "free" food.  If you eat 3 or more servings at 1 meal, count them as 1 carbohydrate serving.  Other Carbohydrates  oz chips or pretzels.   cup ice cream or frozen yogurt.   cup sherbet or sorbet.  2 inch square cake, no frosting.  1 tbs honey, sugar, jam, jelly, or syrup.  2 small cookies.  3 squares of graham crackers.  3 cups popcorn.  6 crackers.  1 cup broth-based soup.  Count 1 cup casserole or other mixed foods as 2 carbohydrate servings.  Foods with less than 20 calories in a serving may be counted as 0 carbohydrates or a "free" food.  You may want to purchase a book or computer software that lists the carbohydrate gram counts of different foods. In addition, the nutrition facts panel on the labels of the foods you eat are a good source of this information. The label will tell you how big the serving size is and the total number of carbohydrate grams you will be eating per serving. Divide this number by 15 to obtain the number of carbohydrate servings in a portion. Remember, 1 carbohydrate serving equals 15 grams of carbohydrate. SERVING SIZES Measuring  foods and serving sizes helps you make sure you are getting the right amount of food. The list below tells how big or small some common serving sizes are. 1 oz.........4 stacked dice.  3 oz.........Deck of cards.  1 tsp........Tip of little finger.  1 tbs........Thumb.  2 tbs........Golf ball.   cup.......Half of a fist.  1 cup........A fist.  SAMPLE DIABETES MEAL PLAN Below is a sample meal plan that includes foods from the grain and starches, dairy, vegetable, fruit, and meat groups. A dietician can individualize a meal plan to fit your calorie needs and tell you the number of servings needed from each food group. However, controlling the total amount of carbohydrates in your meal or snack is more important than making sure you include all of the food groups at every meal. You may interchange carbohydrate containing foods (dairy, starches, and fruits). The meal plan below is an example of a 2000 calorie diet using carbohydrate counting. This meal plan has 17 carbohydrate servings. Breakfast 1   cup oatmeal (2 carb servings).   cup light yogurt (1 carb serving).  1 cup blueberries (1 carb serving).   cup almonds.  Snack 1 large apple (2 carb servings).  1 low-fat string cheese stick.  Lunch Chicken breast salad.  1 cup spinach.   cup chopped tomatoes.  2 oz chicken breast, sliced.  2 tbs low-fat Italian dressing.  12 whole-wheat crackers (2 carb servings).  12 to 15 grapes (1 carb serving).  1 cup low-fat milk (1 carb serving).  Snack 1 cup carrots.   cup hummus (1 carb serving).  Dinner 3 oz broiled salmon.  1 cup brown rice (3 carb servings).  Snack 1  cups steamed broccoli (1 carb serving) drizzled with 1 tsp olive oil and lemon juice.  1 cup light pudding (2 carb servings).  DIABETES MEAL PLANNING WORKSHEET Your dietician can use this worksheet to help you decide how many servings of foods and what types of foods are right for you.  BREAKFAST Food Group and Servings /  Carb Servings Grain/Starches __________________________________ Dairy __________________________________________ Vegetable ______________________________________ Fruit ___________________________________________ Meat __________________________________________ Fat ____________________________________________ LUNCH Food Group and Servings / Carb Servings Grain/Starches ___________________________________ Dairy ___________________________________________ Fruit ____________________________________________ Meat ___________________________________________ Fat _____________________________________________ DINNER Food Group and Servings / Carb Servings Grain/Starches ___________________________________ Dairy ___________________________________________ Fruit ____________________________________________ Meat ___________________________________________ Fat _____________________________________________ SNACKS Food Group and Servings / Carb Servings Grain/Starches ___________________________________ Dairy ___________________________________________ Vegetable _______________________________________ Fruit ____________________________________________ Meat ___________________________________________ Fat _____________________________________________ DAILY TOTALS Starches _________________________ Vegetable ________________________ Fruit ____________________________ Dairy ____________________________ Meat ____________________________ Fat ______________________________ Document Released: 05/18/2005 Document Revised: 08/10/2011 Document Reviewed: 03/29/2009 ExitCare Patient Information 2012 [employer].    

## 2012-03-22 ENCOUNTER — Ambulatory Visit: Payer: Medicare Other | Admitting: Family

## 2012-03-22 ENCOUNTER — Encounter (HOSPITAL_COMMUNITY)
Admission: RE | Admit: 2012-03-22 | Discharge: 2012-03-22 | Disposition: A | Discharge status: Home / Self Care | Payer: Medicare Other | Source: Ambulatory Visit | Attending: Cardiology | Admitting: Cardiology

## 2012-03-22 LAB — GLUCOSE, CAPILLARY
Glucose-Capillary: 137 mg/dL — ABNORMAL HIGH (ref 70–99)
Glucose-Capillary: 99 mg/dL (ref 70–99)

## 2012-03-22 NOTE — Progress Notes (Signed)
Reviewed home exercise with pt today.  Pt plans to walk on treadmill for exercise.  Reviewed THR, pulse, RPE, sign and symptoms, NTG use, and when to call 911 or MD.  Pt voiced understanding. Electronically signed by Harriett Sine MS on Friday March 22 2012 at 1513

## 2012-03-25 ENCOUNTER — Encounter (HOSPITAL_COMMUNITY): Payer: Medicare Other

## 2012-03-27 ENCOUNTER — Encounter (HOSPITAL_COMMUNITY): Payer: Medicare Other

## 2012-03-29 ENCOUNTER — Encounter (HOSPITAL_COMMUNITY): Payer: Medicare Other

## 2012-04-01 ENCOUNTER — Encounter (HOSPITAL_COMMUNITY): Payer: Medicare Other

## 2012-04-03 ENCOUNTER — Encounter (HOSPITAL_COMMUNITY): Payer: Medicare Other

## 2012-04-05 ENCOUNTER — Encounter (HOSPITAL_COMMUNITY): Payer: Medicare Other

## 2012-04-08 ENCOUNTER — Encounter (HOSPITAL_COMMUNITY): Payer: Medicare Other

## 2012-04-10 ENCOUNTER — Encounter (HOSPITAL_COMMUNITY)
Admission: RE | Admit: 2012-04-10 | Discharge: 2012-04-10 | Disposition: A | Discharge status: Home / Self Care | Payer: Medicare Other | Source: Ambulatory Visit | Attending: Cardiology | Admitting: Cardiology

## 2012-04-10 DIAGNOSIS — I10 Essential (primary) hypertension: Secondary | ICD-10-CM | POA: Insufficient documentation

## 2012-04-10 DIAGNOSIS — Z5189 Encounter for other specified aftercare: Secondary | ICD-10-CM | POA: Insufficient documentation

## 2012-04-10 DIAGNOSIS — I252 Old myocardial infarction: Secondary | ICD-10-CM | POA: Insufficient documentation

## 2012-04-10 DIAGNOSIS — K219 Gastro-esophageal reflux disease without esophagitis: Secondary | ICD-10-CM | POA: Insufficient documentation

## 2012-04-10 DIAGNOSIS — F17200 Nicotine dependence, unspecified, uncomplicated: Secondary | ICD-10-CM | POA: Insufficient documentation

## 2012-04-10 DIAGNOSIS — E669 Obesity, unspecified: Secondary | ICD-10-CM | POA: Insufficient documentation

## 2012-04-10 DIAGNOSIS — I2510 Atherosclerotic heart disease of native coronary artery without angina pectoris: Secondary | ICD-10-CM | POA: Insufficient documentation

## 2012-04-10 DIAGNOSIS — E119 Type 2 diabetes mellitus without complications: Secondary | ICD-10-CM | POA: Insufficient documentation

## 2012-04-10 DIAGNOSIS — Z9861 Coronary angioplasty status: Secondary | ICD-10-CM | POA: Insufficient documentation

## 2012-04-10 LAB — GLUCOSE, CAPILLARY: Glucose-Capillary: 92 mg/dL (ref 70–99)

## 2012-04-12 ENCOUNTER — Encounter (HOSPITAL_COMMUNITY)
Admission: RE | Admit: 2012-04-12 | Discharge: 2012-04-12 | Disposition: A | Discharge status: Home / Self Care | Payer: Medicare Other | Source: Ambulatory Visit | Attending: Cardiology | Admitting: Cardiology

## 2012-04-12 LAB — GLUCOSE, CAPILLARY: Glucose-Capillary: 94 mg/dL (ref 70–99)

## 2012-04-15 ENCOUNTER — Encounter (HOSPITAL_COMMUNITY)
Admission: RE | Admit: 2012-04-15 | Discharge: 2012-04-15 | Disposition: A | Discharge status: Home / Self Care | Payer: Medicare Other | Source: Ambulatory Visit | Attending: Cardiology | Admitting: Cardiology

## 2012-04-15 LAB — GLUCOSE, CAPILLARY: Glucose-Capillary: 88 mg/dL (ref 70–99)

## 2012-04-17 ENCOUNTER — Encounter (HOSPITAL_COMMUNITY)
Admission: RE | Admit: 2012-04-17 | Discharge: 2012-04-17 | Disposition: A | Discharge status: Home / Self Care | Payer: Medicare Other | Source: Ambulatory Visit | Attending: Cardiology | Admitting: Cardiology

## 2012-04-17 LAB — GLUCOSE, CAPILLARY: Glucose-Capillary: 108 mg/dL — ABNORMAL HIGH (ref 70–99)

## 2012-04-19 ENCOUNTER — Encounter (HOSPITAL_COMMUNITY)
Admission: RE | Admit: 2012-04-19 | Discharge: 2012-04-19 | Disposition: A | Discharge status: Home / Self Care | Payer: Medicare Other | Source: Ambulatory Visit | Attending: Cardiology | Admitting: Cardiology

## 2012-04-19 LAB — GLUCOSE, CAPILLARY
Glucose-Capillary: 97 mg/dL (ref 70–99)
Glucose-Capillary: 109 mg/dL — ABNORMAL HIGH (ref 70–99)
Glucose-Capillary: 115 mg/dL — ABNORMAL HIGH (ref 70–99)

## 2012-04-22 ENCOUNTER — Encounter (HOSPITAL_COMMUNITY): Payer: Medicare Other

## 2012-04-22 ENCOUNTER — Encounter (HOSPITAL_COMMUNITY)
Admission: RE | Admit: 2012-04-22 | Discharge: 2012-04-22 | Disposition: A | Discharge status: Home / Self Care | Payer: Medicare Other | Source: Ambulatory Visit | Attending: Cardiology | Admitting: Cardiology

## 2012-04-22 LAB — GLUCOSE, CAPILLARY: Glucose-Capillary: 146 mg/dL — ABNORMAL HIGH (ref 70–99)

## 2012-04-24 ENCOUNTER — Encounter (HOSPITAL_COMMUNITY): Payer: Medicare Other

## 2012-04-24 ENCOUNTER — Encounter (HOSPITAL_COMMUNITY)
Admission: RE | Admit: 2012-04-24 | Discharge: 2012-04-24 | Disposition: A | Discharge status: Home / Self Care | Payer: Medicare Other | Source: Ambulatory Visit | Attending: Cardiology | Admitting: Cardiology

## 2012-04-24 NOTE — Progress Notes (Signed)
Pt in today to exercise at 8:15.  Pt pre blood glucose was 249 which surprised pt since he ate what he typically eats in the morning. Pt thought that because of an earlier exercise time.  Post blood glucose was 126.

## 2012-04-26 ENCOUNTER — Encounter (HOSPITAL_COMMUNITY): Payer: Medicare Other

## 2012-04-26 ENCOUNTER — Encounter (HOSPITAL_COMMUNITY)
Admission: RE | Admit: 2012-04-26 | Discharge: 2012-04-26 | Disposition: A | Discharge status: Home / Self Care | Payer: Medicare Other | Source: Ambulatory Visit | Attending: Cardiology | Admitting: Cardiology

## 2012-04-26 LAB — GLUCOSE, CAPILLARY: Glucose-Capillary: 151 mg/dL — ABNORMAL HIGH (ref 70–99)

## 2012-04-29 ENCOUNTER — Other Ambulatory Visit (INDEPENDENT_AMBULATORY_CARE_PROVIDER_SITE_OTHER): Payer: Medicare Other

## 2012-04-29 ENCOUNTER — Encounter (HOSPITAL_COMMUNITY)
Admission: RE | Admit: 2012-04-29 | Discharge: 2012-04-29 | Disposition: A | Discharge status: Home / Self Care | Payer: Medicare Other | Source: Ambulatory Visit | Attending: Cardiology | Admitting: Cardiology

## 2012-04-29 ENCOUNTER — Other Ambulatory Visit: Payer: Medicare Other

## 2012-04-29 ENCOUNTER — Encounter (HOSPITAL_COMMUNITY): Payer: Medicare Other

## 2012-04-29 DIAGNOSIS — I2510 Atherosclerotic heart disease of native coronary artery without angina pectoris: Secondary | ICD-10-CM

## 2012-04-29 DIAGNOSIS — Z125 Encounter for screening for malignant neoplasm of prostate: Secondary | ICD-10-CM

## 2012-04-29 LAB — GLUCOSE, CAPILLARY: Glucose-Capillary: 126 mg/dL — ABNORMAL HIGH (ref 70–99)

## 2012-04-29 LAB — LIPID PANEL
Cholesterol: 112 mg/dL (ref 0–200)
HDL: 47.8 mg/dL (ref >39.00)
Triglycerides: 105 mg/dL (ref 0.0–149.0)
VLDL: 21 mg/dL (ref 0.0–40.0)

## 2012-04-29 LAB — HEPATIC FUNCTION PANEL
ALT: 18 U/L (ref 0–53)
AST: 16 U/L (ref 0–37)
Albumin: 4 g/dL (ref 3.5–5.2)
Total Protein: 6.7 g/dL (ref 6.0–8.3)

## 2012-04-29 LAB — PSA, MEDICARE: PSA: 0.58 ng/ml (ref 0.10–4.00)

## 2012-04-30 NOTE — Progress Notes (Signed)
Quick Note:    Letter mailed.  ______

## 2012-05-01 ENCOUNTER — Encounter (HOSPITAL_COMMUNITY): Payer: Medicare Other

## 2012-05-01 ENCOUNTER — Encounter (HOSPITAL_COMMUNITY)
Admission: RE | Admit: 2012-05-01 | Discharge: 2012-05-01 | Disposition: A | Discharge status: Home / Self Care | Payer: Medicare Other | Source: Ambulatory Visit | Attending: Cardiology | Admitting: Cardiology

## 2012-05-03 ENCOUNTER — Encounter (HOSPITAL_COMMUNITY): Payer: Medicare Other

## 2012-05-03 ENCOUNTER — Encounter (HOSPITAL_COMMUNITY)
Admission: RE | Admit: 2012-05-03 | Discharge: 2012-05-03 | Disposition: A | Discharge status: Home / Self Care | Payer: Medicare Other | Source: Ambulatory Visit | Attending: Cardiology | Admitting: Cardiology

## 2012-05-06 ENCOUNTER — Encounter (HOSPITAL_COMMUNITY): Payer: Medicare Other

## 2012-05-06 DIAGNOSIS — E669 Obesity, unspecified: Secondary | ICD-10-CM | POA: Insufficient documentation

## 2012-05-06 DIAGNOSIS — I2510 Atherosclerotic heart disease of native coronary artery without angina pectoris: Secondary | ICD-10-CM | POA: Insufficient documentation

## 2012-05-06 DIAGNOSIS — I252 Old myocardial infarction: Secondary | ICD-10-CM | POA: Insufficient documentation

## 2012-05-06 DIAGNOSIS — Z9861 Coronary angioplasty status: Secondary | ICD-10-CM | POA: Insufficient documentation

## 2012-05-06 DIAGNOSIS — K219 Gastro-esophageal reflux disease without esophagitis: Secondary | ICD-10-CM | POA: Insufficient documentation

## 2012-05-06 DIAGNOSIS — E119 Type 2 diabetes mellitus without complications: Secondary | ICD-10-CM | POA: Insufficient documentation

## 2012-05-06 DIAGNOSIS — F17200 Nicotine dependence, unspecified, uncomplicated: Secondary | ICD-10-CM | POA: Insufficient documentation

## 2012-05-06 DIAGNOSIS — I10 Essential (primary) hypertension: Secondary | ICD-10-CM | POA: Insufficient documentation

## 2012-05-06 DIAGNOSIS — Z5189 Encounter for other specified aftercare: Secondary | ICD-10-CM | POA: Insufficient documentation

## 2012-05-08 ENCOUNTER — Encounter (HOSPITAL_COMMUNITY): Payer: Medicare Other

## 2012-05-10 ENCOUNTER — Encounter (HOSPITAL_COMMUNITY): Payer: Medicare Other

## 2012-05-13 ENCOUNTER — Encounter (HOSPITAL_COMMUNITY)
Admission: RE | Admit: 2012-05-13 | Discharge: 2012-05-13 | Disposition: A | Discharge status: Home / Self Care | Payer: Medicare Other | Source: Ambulatory Visit | Attending: Cardiology | Admitting: Cardiology

## 2012-05-13 ENCOUNTER — Encounter (HOSPITAL_COMMUNITY): Payer: Medicare Other

## 2012-05-15 ENCOUNTER — Encounter (HOSPITAL_COMMUNITY): Payer: Medicare Other

## 2012-05-15 ENCOUNTER — Encounter (HOSPITAL_COMMUNITY)
Admission: RE | Admit: 2012-05-15 | Discharge: 2012-05-15 | Disposition: A | Discharge status: Home / Self Care | Payer: Medicare Other | Source: Ambulatory Visit | Attending: Cardiology | Admitting: Cardiology

## 2012-05-17 ENCOUNTER — Encounter (HOSPITAL_COMMUNITY)
Admission: RE | Admit: 2012-05-17 | Discharge: 2012-05-17 | Disposition: A | Discharge status: Home / Self Care | Payer: Medicare Other | Source: Ambulatory Visit | Attending: Cardiology | Admitting: Cardiology

## 2012-05-17 ENCOUNTER — Encounter (HOSPITAL_COMMUNITY): Payer: Medicare Other

## 2012-05-20 ENCOUNTER — Encounter (HOSPITAL_COMMUNITY): Payer: Medicare Other

## 2012-05-20 ENCOUNTER — Encounter (HOSPITAL_COMMUNITY)
Admission: RE | Admit: 2012-05-20 | Discharge: 2012-05-20 | Disposition: A | Discharge status: Home / Self Care | Payer: Medicare Other | Source: Ambulatory Visit | Attending: Cardiology | Admitting: Cardiology

## 2012-05-21 ENCOUNTER — Other Ambulatory Visit: Payer: Self-pay | Admitting: *Deleted

## 2012-05-21 DIAGNOSIS — I2510 Atherosclerotic heart disease of native coronary artery without angina pectoris: Secondary | ICD-10-CM

## 2012-05-21 MED ORDER — ATORVASTATIN CALCIUM 40 MG PO TABS: 40.0000 mg | ORAL_TABLET | Freq: Every day | ORAL | Status: DC

## 2012-05-22 ENCOUNTER — Encounter (HOSPITAL_COMMUNITY): Payer: Medicare Other

## 2012-05-22 ENCOUNTER — Encounter (HOSPITAL_COMMUNITY)
Admission: RE | Admit: 2012-05-22 | Discharge: 2012-05-22 | Disposition: A | Discharge status: Home / Self Care | Payer: Medicare Other | Source: Ambulatory Visit | Attending: Cardiology | Admitting: Cardiology

## 2012-05-24 ENCOUNTER — Encounter (HOSPITAL_COMMUNITY): Payer: Medicare Other

## 2012-05-27 ENCOUNTER — Encounter (HOSPITAL_COMMUNITY)
Admission: RE | Admit: 2012-05-27 | Discharge: 2012-05-27 | Disposition: A | Discharge status: Home / Self Care | Payer: Medicare Other | Source: Ambulatory Visit | Attending: Cardiology | Admitting: Cardiology

## 2012-05-29 ENCOUNTER — Encounter (HOSPITAL_COMMUNITY)
Admission: RE | Admit: 2012-05-29 | Discharge: 2012-05-29 | Disposition: A | Discharge status: Home / Self Care | Payer: Medicare Other | Source: Ambulatory Visit | Attending: Cardiology | Admitting: Cardiology

## 2012-05-31 ENCOUNTER — Encounter (HOSPITAL_COMMUNITY)
Admission: RE | Admit: 2012-05-31 | Discharge: 2012-05-31 | Disposition: A | Discharge status: Home / Self Care | Payer: Medicare Other | Source: Ambulatory Visit | Attending: Cardiology | Admitting: Cardiology

## 2012-06-03 ENCOUNTER — Encounter (HOSPITAL_COMMUNITY)
Admission: RE | Admit: 2012-06-03 | Discharge: 2012-06-03 | Disposition: A | Discharge status: Home / Self Care | Payer: Medicare Other | Source: Ambulatory Visit | Attending: Cardiology | Admitting: Cardiology

## 2012-06-03 LAB — GLUCOSE, CAPILLARY: Glucose-Capillary: 174 mg/dL — ABNORMAL HIGH (ref 70–99)

## 2012-06-05 ENCOUNTER — Encounter (HOSPITAL_COMMUNITY)
Admission: RE | Admit: 2012-06-05 | Discharge: 2012-06-05 | Disposition: A | Discharge status: Home / Self Care | Payer: Medicare Other | Source: Ambulatory Visit | Attending: Cardiology | Admitting: Cardiology

## 2012-06-05 DIAGNOSIS — E119 Type 2 diabetes mellitus without complications: Secondary | ICD-10-CM | POA: Insufficient documentation

## 2012-06-05 DIAGNOSIS — E669 Obesity, unspecified: Secondary | ICD-10-CM | POA: Insufficient documentation

## 2012-06-05 DIAGNOSIS — I10 Essential (primary) hypertension: Secondary | ICD-10-CM | POA: Insufficient documentation

## 2012-06-05 DIAGNOSIS — I252 Old myocardial infarction: Secondary | ICD-10-CM | POA: Insufficient documentation

## 2012-06-05 DIAGNOSIS — Z9861 Coronary angioplasty status: Secondary | ICD-10-CM | POA: Insufficient documentation

## 2012-06-05 DIAGNOSIS — I2510 Atherosclerotic heart disease of native coronary artery without angina pectoris: Secondary | ICD-10-CM | POA: Insufficient documentation

## 2012-06-05 DIAGNOSIS — F17200 Nicotine dependence, unspecified, uncomplicated: Secondary | ICD-10-CM | POA: Insufficient documentation

## 2012-06-05 DIAGNOSIS — K219 Gastro-esophageal reflux disease without esophagitis: Secondary | ICD-10-CM | POA: Insufficient documentation

## 2012-06-05 DIAGNOSIS — Z5189 Encounter for other specified aftercare: Secondary | ICD-10-CM | POA: Insufficient documentation

## 2012-06-07 ENCOUNTER — Encounter (HOSPITAL_COMMUNITY)
Admission: RE | Admit: 2012-06-07 | Discharge: 2012-06-07 | Disposition: A | Discharge status: Home / Self Care | Payer: Medicare Other | Source: Ambulatory Visit | Attending: Cardiology | Admitting: Cardiology

## 2012-06-10 ENCOUNTER — Encounter (HOSPITAL_COMMUNITY)
Admission: RE | Admit: 2012-06-10 | Discharge: 2012-06-10 | Disposition: A | Discharge status: Home / Self Care | Payer: Medicare Other | Source: Ambulatory Visit | Attending: Cardiology | Admitting: Cardiology

## 2012-06-12 ENCOUNTER — Encounter (HOSPITAL_COMMUNITY)
Admission: RE | Admit: 2012-06-12 | Discharge: 2012-06-12 | Disposition: A | Discharge status: Home / Self Care | Payer: Medicare Other | Source: Ambulatory Visit | Attending: Cardiology | Admitting: Cardiology

## 2012-06-12 LAB — GLUCOSE, CAPILLARY: Glucose-Capillary: 126 mg/dL — ABNORMAL HIGH (ref 70–99)

## 2012-06-14 ENCOUNTER — Encounter (HOSPITAL_COMMUNITY)
Admission: RE | Admit: 2012-06-14 | Discharge: 2012-06-14 | Disposition: A | Discharge status: Home / Self Care | Payer: Medicare Other | Source: Ambulatory Visit | Attending: Cardiology | Admitting: Cardiology

## 2012-06-14 ENCOUNTER — Encounter (HOSPITAL_COMMUNITY): Payer: Self-pay

## 2012-06-14 NOTE — Progress Notes (Signed)
Last day of exercise at cardiac rehab.  Pt plans to exercise on his own at local gym silver sneakers program.  Also,  he was given information about the cardiac maintenance program at his request.

## 2012-06-17 ENCOUNTER — Encounter (HOSPITAL_COMMUNITY): Payer: Medicare Other

## 2012-06-19 ENCOUNTER — Encounter: Payer: Self-pay | Admitting: Cardiology

## 2012-06-19 ENCOUNTER — Encounter (HOSPITAL_COMMUNITY): Payer: Medicare Other

## 2012-06-19 ENCOUNTER — Ambulatory Visit (INDEPENDENT_AMBULATORY_CARE_PROVIDER_SITE_OTHER): Payer: Medicare Other | Admitting: Cardiology

## 2012-06-19 VITALS — BP 164/100 | HR 56 | Ht 71.5 in | Wt 267.0 lb

## 2012-06-19 DIAGNOSIS — E785 Hyperlipidemia, unspecified: Secondary | ICD-10-CM

## 2012-06-19 DIAGNOSIS — I2510 Atherosclerotic heart disease of native coronary artery without angina pectoris: Secondary | ICD-10-CM

## 2012-06-19 DIAGNOSIS — I10 Essential (primary) hypertension: Secondary | ICD-10-CM

## 2012-06-19 NOTE — Patient Instructions (Addendum)
Your physician recommends that you continue on your current medications as directed. Please refer to the Current Medication list given to you today.  Your physician recommends that you schedule a follow-up appointment in: January 2014

## 2012-06-21 ENCOUNTER — Encounter (HOSPITAL_COMMUNITY): Payer: Medicare Other

## 2012-06-22 NOTE — Progress Notes (Signed)
HPI:  We had a nice discussion with the patient today. He's been in the cardiac rehabilitation program, and done well. In addition, he is no longer smoking. He seems to be tolerating his exercise. His blood pressures are actually in general quite a bit better than they are recorded today. He describes no new symptoms.  Current Outpatient Prescriptions  Medication Sig Dispense Refill  . acyclovir (ZOVIRAX) 200 MG capsule Take 400 mg by mouth 3 (three) times daily as needed. For cold sores      . aspirin 81 MG tablet Take 81 mg by mouth daily.      Marland Kitchen atorvastatin (LIPITOR) 40 MG tablet Take 1 tablet (40 mg total) by mouth daily.  90 tablet  3  . carvedilol (COREG) 12.5 MG tablet Take 1 tablet (12.5 mg total) by mouth 2 (two) times daily with a meal.  60 tablet  6  . metFORMIN (GLUCOPHAGE) 500 MG tablet Take 1 tablet (500 mg total) by mouth daily with breakfast.  30 tablet  3  . Multiple Vitamin (MULITIVITAMIN WITH MINERALS) TABS Take 1 tablet by mouth daily.      . diphenhydrAMINE (BENADRYL) 25 MG tablet Take 1 tablet (25 mg total) by mouth every 6 (six) hours. For three days, then as needed  20 tablet  0    Allergies  Allergen Reactions  . Lisinopril Swelling    Swollen tongue   . Penicillins Hives and Rash    Past Medical History  Diagnosis Date  . HTN (hypertension)   . Diabetes mellitus type 2, noninsulin dependent     a. diagnosed 2012  . Coronary artery disease   . Myocardial infarction   . Diabetes mellitus   . GERD (gastroesophageal reflux disease)   . Atrial fibrillation 12/28/2011  . 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 TOBACCO ABUSE 04/14/2008    Past Surgical History  Procedure Date  . Cardiac catheterization     12-01-2011  . Left arm surgery as a child   . 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 OR;  Service: Open Heart Surgery;  Laterality: N/A;  Times five, on  pump, using endoscopically harvested right greater saphenous vein and left internal mammary artery.     Family History  Problem Relation Age of Onset  . Heart attack Father     died @ 25's  . Heart attack Mother     died @ 38's  . Heart attack Brother     died @ 52  . Heart attack Brother     died @ 47  . Anesthesia problems Neg Hx   . Hypotension Neg Hx   . Malignant hyperthermia Neg Hx   . Pseudochol deficiency Neg Hx     History   Social History  . Marital Status: Widowed    Spouse Name: N/A    Number of Children: N/A  . Years of Education: N/A   Occupational History  . Not on file.   Social History Main Topics  . Smoking status: Former Smoker -- 2 years    Types: Cigars  . Smokeless tobacco: Not on file   Comment: smokes 1 cigar/day for many years  . Alcohol Use: No  . Drug Use: No  . Sexually Active: Not Currently   Other Topics Concern  . Not on file   Social History Narrative   Pt lives by himself in [place].  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  He does not routinely exercise or adhere to any particular diet.    ROS: Please see the HPI.  All other systems reviewed and negative.  PHYSICAL EXAM:  BP 164/100  Pulse 56  Ht 5' 11.5" (1.816 m)  Wt 267 lb (121.11 kg)  BMI 36.72 kg/m2  General: Well developed, well nourished, in no acute distress. Head:  Normocephalic and atraumatic. Neck: no JVD Lungs: Clear to auscultation and percussion. Heart: Normal S1 and S2.  No murmur, rubs or gallops.  Pulses: Pulses normal in all 4 extremities. Extremities: No clubbing or cyanosis. No edema. Neurologic: Alert and oriented x 3.  EKG: Borderline sinus bradycardia with first degree av block.  Inferior MI, age indeterminate.    ASSESSMENT AND PLAN:

## 2012-06-23 NOTE — Assessment & Plan Note (Signed)
His most recent LDL was at target, and given his overall risks and anatomic findings continued medical management, with regard for LDL of less than 70 would be recommended

## 2012-06-23 NOTE — Assessment & Plan Note (Signed)
Her blood pressures not ideally controlled in the office today, although he assures me it is been somewhat better we may need to consider adding a different agent. Had a problem with an ACE inhibitor. Amlodipine might be an alternative.

## 2012-06-23 NOTE — Assessment & Plan Note (Signed)
The patient's had recent bypass, and has been in cardiac rehabilitation. He continues to recover and is doing well. Continued medical management will be recommended.

## 2012-07-20 ENCOUNTER — Other Ambulatory Visit: Payer: Self-pay | Admitting: Family

## 2012-08-19 ENCOUNTER — Other Ambulatory Visit: Payer: Self-pay | Admitting: Family

## 2012-09-17 ENCOUNTER — Other Ambulatory Visit: Payer: Self-pay | Admitting: Family

## 2012-09-18 ENCOUNTER — Telehealth: Payer: Self-pay | Admitting: Family

## 2012-09-18 NOTE — Telephone Encounter (Signed)
Pt would like a refill on his metFORMIN (GLUCOPHAGE) 500 MG tablet [facility] on [address].

## 2012-09-19 MED ORDER — METFORMIN HCL 500 MG PO TABS: 500.0000 mg | ORAL_TABLET | Freq: Every day | ORAL | Status: DC

## 2012-09-19 NOTE — Telephone Encounter (Signed)
PT AWARE NOT FURTHER REFILLS WITHOUT AN OFFICE VISIT

## 2012-09-23 ENCOUNTER — Encounter: Payer: Self-pay | Admitting: Cardiology

## 2012-09-23 ENCOUNTER — Ambulatory Visit (INDEPENDENT_AMBULATORY_CARE_PROVIDER_SITE_OTHER): Payer: Medicare Other | Admitting: Cardiology

## 2012-09-23 VITALS — BP 156/99 | HR 67 | Ht 71.0 in | Wt 282.0 lb

## 2012-09-23 DIAGNOSIS — E785 Hyperlipidemia, unspecified: Secondary | ICD-10-CM

## 2012-09-23 DIAGNOSIS — I10 Essential (primary) hypertension: Secondary | ICD-10-CM

## 2012-09-23 DIAGNOSIS — I2510 Atherosclerotic heart disease of native coronary artery without angina pectoris: Secondary | ICD-10-CM

## 2012-09-23 NOTE — Progress Notes (Signed)
HPI: The patient returns in a followup visit. He generally is doing quite well. He denies any chest pain or shortness of breath. His weight is up about 15 pounds, and he notes that he has not been following his diet quite as well. He denies any chest pain or other cardiac complaints   Current Outpatient Prescriptions  Medication Sig Dispense Refill  . acyclovir (ZOVIRAX) 200 MG capsule Take 400 mg by mouth 3 (three) times daily as needed. For cold sores      . aspirin 81 MG tablet Take 81 mg by mouth daily.      Marland Kitchen atorvastatin (LIPITOR) 40 MG tablet Take 1 tablet (40 mg total) by mouth daily.  90 tablet  3  . carvedilol (COREG) 12.5 MG tablet Take 1 tablet (12.5 mg total) by mouth 2 (two) times daily with a meal.  60 tablet  6  . metFORMIN (GLUCOPHAGE) 500 MG tablet Take 1 tablet (500 mg total) by mouth daily with breakfast.  30 tablet  0  . Multiple Vitamin (MULITIVITAMIN WITH MINERALS) TABS Take 1 tablet by mouth daily.        Allergies  Allergen Reactions  . Lisinopril Swelling    Swollen tongue   . Penicillins Hives and Rash    Past Medical History  Diagnosis Date  . HTN (hypertension)   . Diabetes mellitus type 2, noninsulin dependent     a. diagnosed 2012  . Coronary artery disease   . Myocardial infarction   . Diabetes mellitus   . GERD (gastroesophageal reflux disease)   . Atrial fibrillation 12/28/2011  . 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 TOBACCO ABUSE 04/14/2008    Past Surgical History  Procedure Date  . Cardiac catheterization     12-01-2011  . Left arm surgery as a child   . 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     Family History  Problem Relation Age of Onset  . Heart attack Father    died @ 47's  . Heart attack Mother     died @ 71's  . Heart attack Brother     died @ 73  . Heart attack Brother     died @ 33  . Anesthesia problems Neg Hx   . Hypotension Neg Hx   . Malignant hyperthermia Neg Hx   . Pseudochol deficiency Neg Hx     History   Social History  . Marital Status: Widowed    Spouse Name: N/A    Number of Children: N/A  . Years of Education: N/A   Occupational History  . Not on file.   Social History Main Topics  . Smoking status: Former Smoker -- 2 years    Types: Cigars  . Smokeless tobacco: Not on file     Comment: smokes 1 cigar/day for many years  . Alcohol Use: No  . Drug Use: No  . Sexually Active: Not Currently   Other Topics Concern  . Not on file   Social History Narrative   Pt lives by himself in [place].  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  He does not routinely exercise or adhere to any particular diet.    ROS: Please see the HPI.  All other systems reviewed and negative.  PHYSICAL EXAM:  BP 156/99  Pulse 67  Ht 5\' 11"  (1.803 m)  Wt 282 lb (127.914 kg)  BMI 39.33 kg/m2  SpO2 96%  General: Well developed, well nourished, in no acute distress. Head:  Normocephalic and atraumatic. Neck: no JVD Lungs: Clear to auscultation and percussion. Heart: Normal S1 and S2.  No murmur, rubs or gallops.  Abdomen:  Normal bowel sounds; soft; non tender; no organomegaly Pulses: Pulses normal in all 4 extremities. Extremities: No clubbing or cyanosis. No edema. Neurologic: Alert and oriented x 3.  EKG:  NSR.  Inferior MI, age indeterminate.    ASSESSMENT AND PLAN:

## 2012-09-23 NOTE — Assessment & Plan Note (Signed)
At target when last checked.

## 2012-09-23 NOTE — Assessment & Plan Note (Signed)
The patient is elevated blood pressure. He is now well controlled in addition, he's gained approximately 15 pounds since I last saw him. He would like to hold off on adding another medicine as his pressures were well-controlled in rehabilitation. He will recheck pressures at home for the next several days and report them to [place]. We will see him back in a couple of months to reassess the overall situation. We will also get an abdominal ultrasound to exclude an AAA--hypertensive male with difficult exam.

## 2012-09-23 NOTE — Patient Instructions (Addendum)
Your physician has requested that you regularly monitor and record your blood pressure readings at home. Please use the same machine at the same time of day to check your readings and record them to bring to your follow-up visit. Please call the office in 2 WEEKS with your BP readings.  At that time we will make further medication adjustments.   Your physician recommends that you schedule a follow-up appointment in: MARCH 2014  Your physician recommends that you continue on your current medications as directed. Please refer to the Current Medication list given to you today.  Your physician has requested that you have an abdominal aorta duplex. During this test, an ultrasound is used to evaluate the aorta. Allow 30 minutes for this exam. Do not eat after midnight the day before and avoid carbonated beverages

## 2012-09-23 NOTE — Assessment & Plan Note (Signed)
Stable at present.  No new symptoms.

## 2012-09-27 ENCOUNTER — Other Ambulatory Visit: Payer: Self-pay | Admitting: *Deleted

## 2012-09-27 MED ORDER — CARVEDILOL 12.5 MG PO TABS: 12.5000 mg | ORAL_TABLET | Freq: Two times a day (BID) | ORAL | Status: DC

## 2012-10-11 ENCOUNTER — Encounter (INDEPENDENT_AMBULATORY_CARE_PROVIDER_SITE_OTHER): Payer: Medicare Other

## 2012-10-11 DIAGNOSIS — E785 Hyperlipidemia, unspecified: Secondary | ICD-10-CM

## 2012-10-11 DIAGNOSIS — I2510 Atherosclerotic heart disease of native coronary artery without angina pectoris: Secondary | ICD-10-CM

## 2012-10-11 DIAGNOSIS — I10 Essential (primary) hypertension: Secondary | ICD-10-CM

## 2012-10-25 ENCOUNTER — Telehealth: Payer: Self-pay | Admitting: Cardiology

## 2012-10-25 NOTE — Telephone Encounter (Signed)
2/9 @ 4pm 158/96 @ 10am 163/93  2/10 @ 8am  149/89 and @ 9p 188/99   2/11 @ 7:45a 148/91   2/12 @ 2:20p 172/89   2/13 @ 2:20p 174/90   2/15 @ 11am 158/81  2/19 @ 930p 154/87   2/20 @ 8am 151/89 @ 430p 158/84  2/21 @ 8am 174/98

## 2012-10-30 NOTE — Telephone Encounter (Signed)
Called patient.  Would favor adding back amlodipine.  5mg  per day.  He had reaction to ACE inhibitor.  He did not answer.  Message left.

## 2012-11-01 MED ORDER — AMLODIPINE BESYLATE 5 MG PO TABS: 5.0000 mg | ORAL_TABLET | Freq: Every day | ORAL | Status: DC

## 2012-11-01 NOTE — Telephone Encounter (Signed)
I spoke with the pt and made him aware of Dr Rosalyn Charters recommendation.  Rx sent to pharmacy.  The pt will continue to monitor BP and contact the office if it remains elevated.

## 2012-11-11 ENCOUNTER — Telehealth: Payer: Self-pay | Admitting: Cardiology

## 2012-11-11 NOTE — Telephone Encounter (Signed)
Called patient and asked him to check his BP every day.  It has not improved to much.  He will call us on Friday with the results.  FU is scheduled March 24.

## 2012-11-19 ENCOUNTER — Telehealth: Payer: Self-pay | Admitting: Cardiology

## 2012-11-19 MED ORDER — AMLODIPINE BESYLATE 5 MG PO TABS: ORAL_TABLET | ORAL | Status: DC

## 2012-11-19 NOTE — Telephone Encounter (Signed)
Will forward to Lauren and Dr. Riley Kill.

## 2012-11-19 NOTE — Telephone Encounter (Signed)
Please have patient increase his amlodipine to 7.5 mg, or 1.5 tabs daily, and continue to check his BPs.

## 2012-11-19 NOTE — Telephone Encounter (Signed)
11-02-12 154/91, 11-03-12 166/104, 11-04-12 158/95, 11-05-12 184/113, 11-06-12 141/75 and 155/82, 11-07-12 146/89 and 167/100, 11-08-12 144/98, 11-11-12 169/94, 11-12-12 179/99 , 11-14-12 153/99, 11-15-12 153/98, 11-16-12 155/98, and 11-19-12 150/194 was to let dr Riley Kill know bp readings after starting norvasc   Pt has appt 11-25-12

## 2012-11-19 NOTE — Telephone Encounter (Signed)
Spoke with patient was told Dr.Stuckey advised to increase Amlodipine to 7.5 mg daily.Advised to continue to monitor B/P and keep appointment with Dr.Stuckey Monday 11/25/12.

## 2012-11-25 ENCOUNTER — Encounter: Payer: Self-pay | Admitting: Cardiology

## 2012-11-25 ENCOUNTER — Ambulatory Visit (INDEPENDENT_AMBULATORY_CARE_PROVIDER_SITE_OTHER): Payer: Medicare Other | Admitting: Cardiology

## 2012-11-25 VITALS — BP 142/84 | HR 56 | Ht 71.0 in | Wt 279.0 lb

## 2012-11-25 DIAGNOSIS — E785 Hyperlipidemia, unspecified: Secondary | ICD-10-CM

## 2012-11-25 DIAGNOSIS — I10 Essential (primary) hypertension: Secondary | ICD-10-CM

## 2012-11-25 MED ORDER — AMLODIPINE BESYLATE 10 MG PO TABS: 10.0000 mg | ORAL_TABLET | Freq: Every day | ORAL | Status: DC

## 2012-11-25 MED ORDER — CARVEDILOL 12.5 MG PO TABS: 12.5000 mg | ORAL_TABLET | Freq: Two times a day (BID) | ORAL | Status: DC

## 2012-11-25 NOTE — Assessment & Plan Note (Signed)
Not well controlled.  Will increase amlodipine to 10 mg daily.  No change otherwise.  Carvedilol will remain at current dose.

## 2012-11-25 NOTE — Progress Notes (Signed)
HPI:  This nice patient is in for followup. Overall he is doing pretty well. However his blood pressure remains elevated. A thorough discussion about this today. He is tolerating his medications reasonably well.  Current Outpatient Prescriptions  Medication Sig Dispense Refill  . acyclovir (ZOVIRAX) 200 MG capsule Take 400 mg by mouth 3 (three) times daily as needed. For cold sores      . amLODipine (NORVASC) 5 MG tablet Take 7.5 mg daily. (1 1/2 ) tablets  60 tablet  6  . aspirin 81 MG tablet Take 81 mg by mouth daily.      Marland Kitchen atorvastatin (LIPITOR) 40 MG tablet Take 1 tablet (40 mg total) by mouth daily.  90 tablet  3  . carvedilol (COREG) 12.5 MG tablet Take 1 tablet (12.5 mg total) by mouth 2 (two) times daily with a meal.  60 tablet  6  . metFORMIN (GLUCOPHAGE) 500 MG tablet Take 1 tablet (500 mg total) by mouth daily with breakfast.  30 tablet  0  . Multiple Vitamin (MULITIVITAMIN WITH MINERALS) TABS Take 1 tablet by mouth daily.       No current facility-administered medications for this visit.    Allergies  Allergen Reactions  . Lisinopril Swelling    Swollen tongue   . Penicillins Hives and Rash    Past Medical History  Diagnosis Date  . HTN (hypertension)   . Diabetes mellitus type 2, noninsulin dependent     a. diagnosed 2012  . Coronary artery disease   . Myocardial infarction   . Diabetes mellitus   . GERD (gastroesophageal reflux disease)   . Atrial fibrillation 12/28/2011  . 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 TOBACCO ABUSE 04/14/2008    Past Surgical History  Procedure Laterality Date  . Cardiac catheterization      12-01-2011  . Left arm surgery as a child    . Coronary artery bypass graft  12/13/2011    Procedure: CORONARY ARTERY BYPASS GRAFTING (CABG);  Surgeon: Alleen Borne, MD;  Location: [facility] OR;  Service: Open Heart Surgery;  Laterality: N/A;  Times five, on pump, using endoscopically harvested right greater saphenous vein and left internal mammary  artery.     Family History  Problem Relation Age of Onset  . Heart attack Father     died @ 83's  . Heart attack Mother     died @ 96's  . Heart attack Brother     died @ 63  . Heart attack Brother     died @ 61  . Anesthesia problems Neg Hx   . Hypotension Neg Hx   . Malignant hyperthermia Neg Hx   . Pseudochol deficiency Neg Hx     History   Social History  . Marital Status: Widowed    Spouse Name: N/A    Number of Children: N/A  . Years of Education: N/A   Occupational History  . Not on file.   Social History Main Topics  . Smoking status: Former Smoker -- 2 years    Types: Cigars  . Smokeless tobacco: Not on file     Comment: smokes 1 cigar/day for many years  . Alcohol Use: No  . Drug Use: No  . Sexually Active: Not Currently   Other Topics Concern  . Not on file   Social History Narrative   Pt lives by himself in [place].  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  He does not routinely exercise or adhere to any particular diet.  ROS: Please see the HPI.  All other systems reviewed and negative.  PHYSICAL EXAM:  BP 142/84  Pulse 56  Ht 5\' 11"  (1.803 m)  Wt 279 lb (126.554 kg)  BMI 38.93 kg/m2  SpO2 98%  General: Well developed, well nourished, in no acute distress. Head:  Normocephalic and atraumatic. Neck: no JVD Lungs: Clear to auscultation and percussion. Heart: Normal S1 and S2.  No murmur, rubs or gallops.  Pulses: Pulses normal in all 4 extremities. Extremities: No clubbing or cyanosis. No edema. Neurologic: Alert and oriented x 3.  EKG:  SB. Inferior MI, old.  No acute changes.    ASSESSMENT AND PLAN:

## 2012-11-25 NOTE — Patient Instructions (Signed)
Increase Amlodipine to 10mg  every day  Follow up with Dr.Hochrein in 2 months

## 2012-11-25 NOTE — Assessment & Plan Note (Signed)
Stable no chest pain, continues to do well.

## 2012-11-26 NOTE — Assessment & Plan Note (Signed)
At target last check.

## 2012-12-15 IMAGING — CR DG CHEST 1V PORT
1 series · 1 of 1 positions shown · non-contrast
Comparison: 12/13/2011.

CLINICAL DATA: Postop open heart surgery.

PORTABLE CHEST - 1 VIEW

[AP]
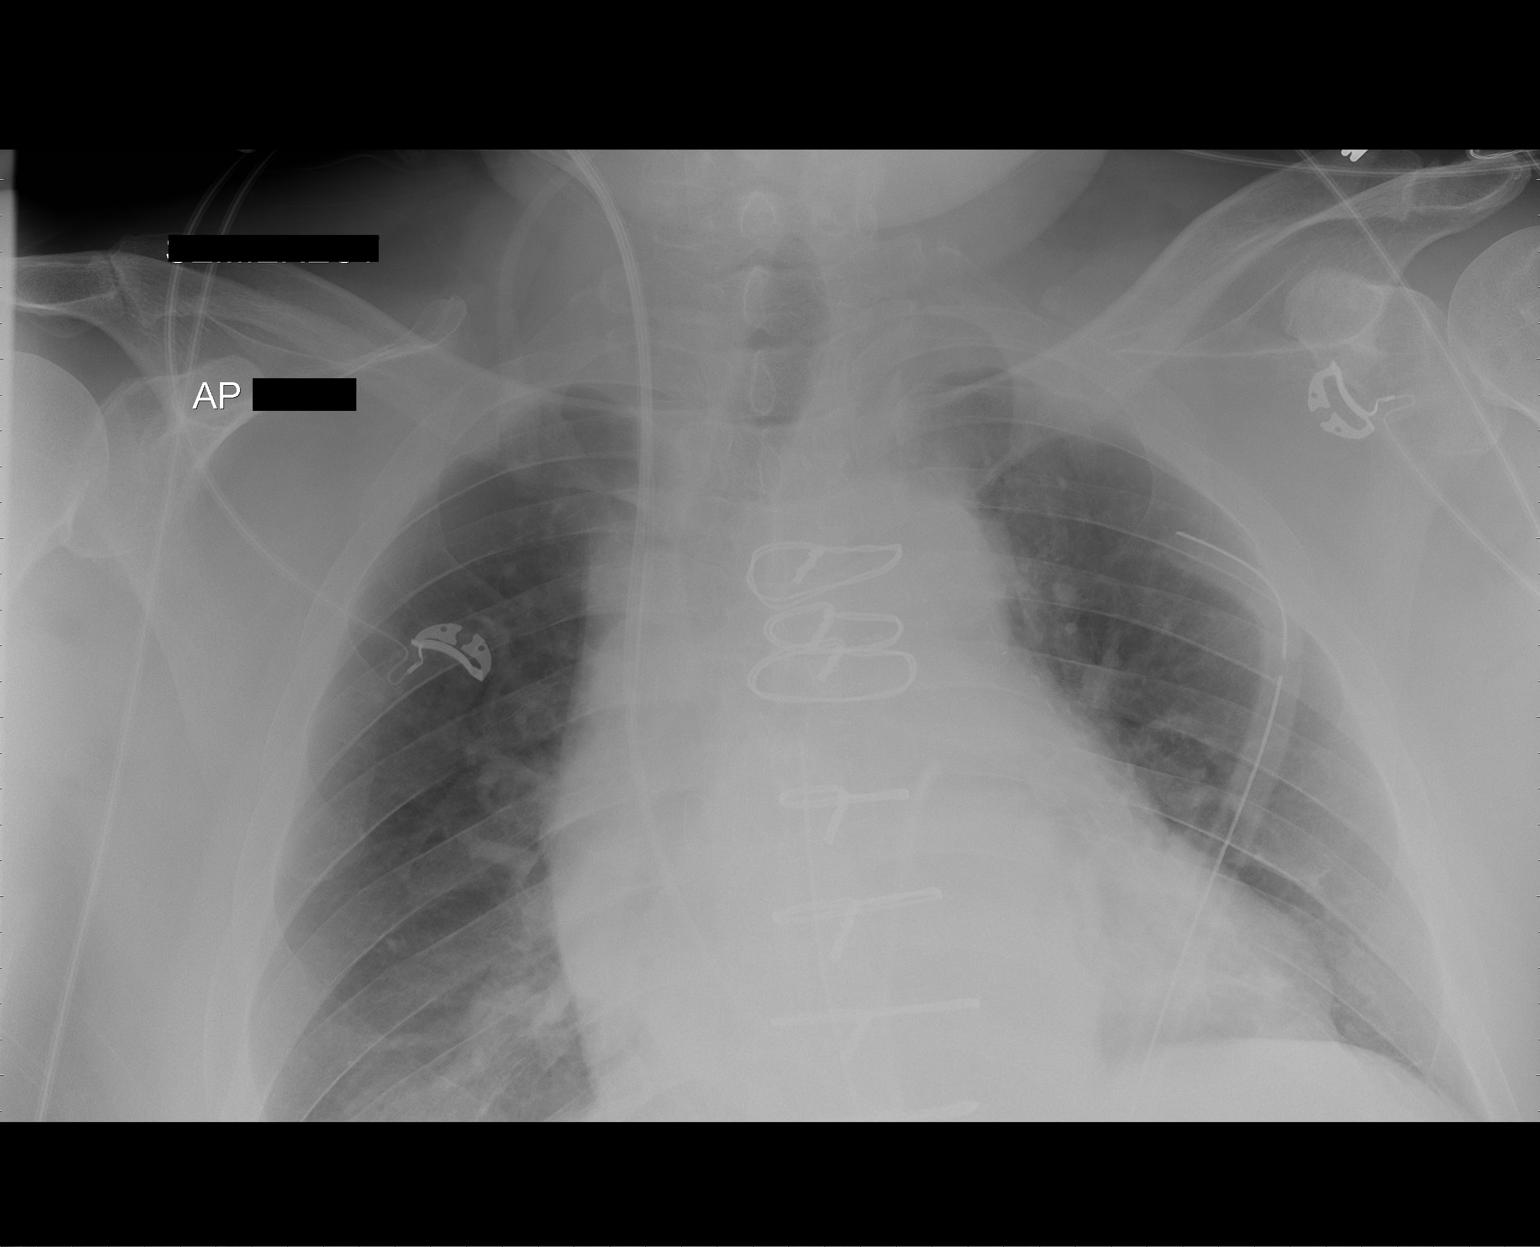

[1 of 1 positions shown; findings below may reference images not displayed]

FINDINGS: Trachea is midline.  Cardiomediastinal silhouette is
mildly prominent, stable.  Right IJ Swan-Ganz catheter tip projects
over the proximal left pulmonary artery.  Mediastinal drains and
left chest tube remain in place.  Sternotomy wires are unchanged in
position.  Interval extubation.

Lungs are somewhat low in volume with bibasilar atelectasis.  No
definite pneumothorax or pleural fluid.
IMPRESSION: Low lung volumes with bibasilar atelectasis.

## 2012-12-29 IMAGING — CR DG CHEST 2V
2 series · 2 of 2 positions shown · non-contrast
Comparison: 12/17/2011

CLINICAL DATA: Status post CABG, left chest pain

CHEST - 2 VIEW

[view not recorded (1 of 2)]
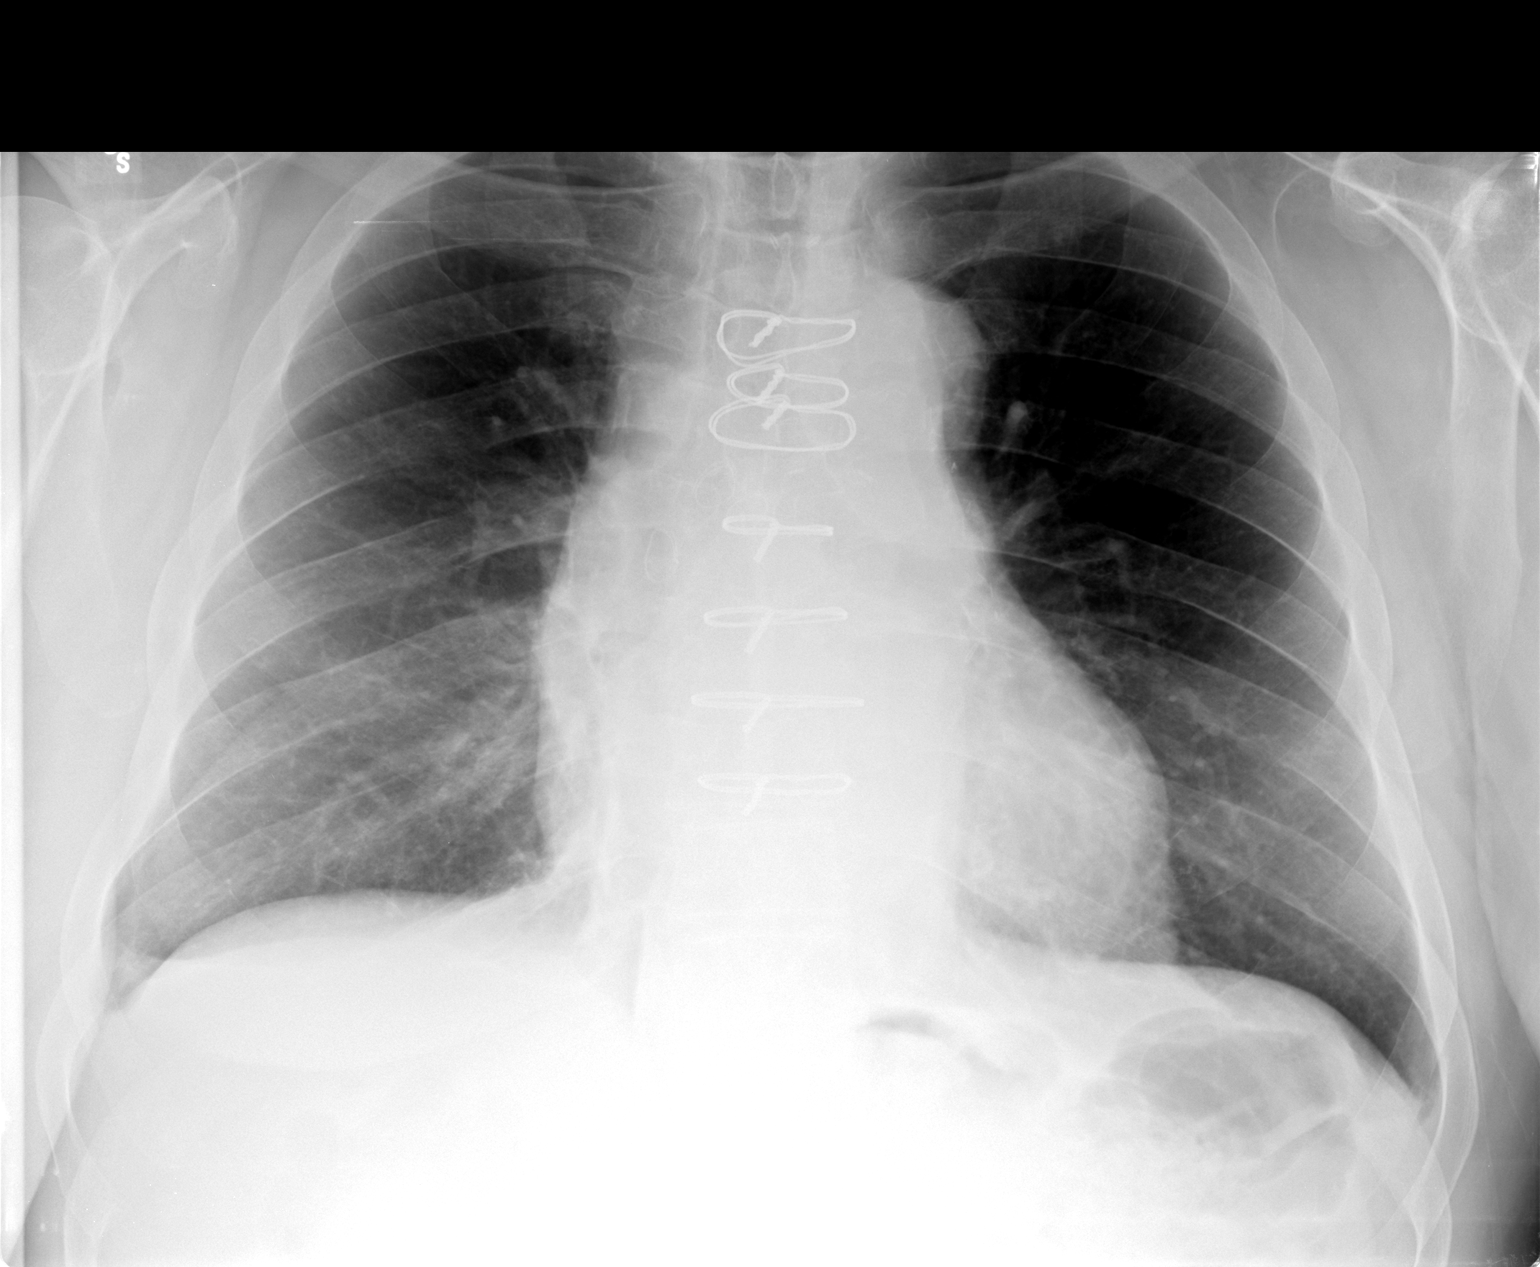

[view not recorded (2 of 2)]
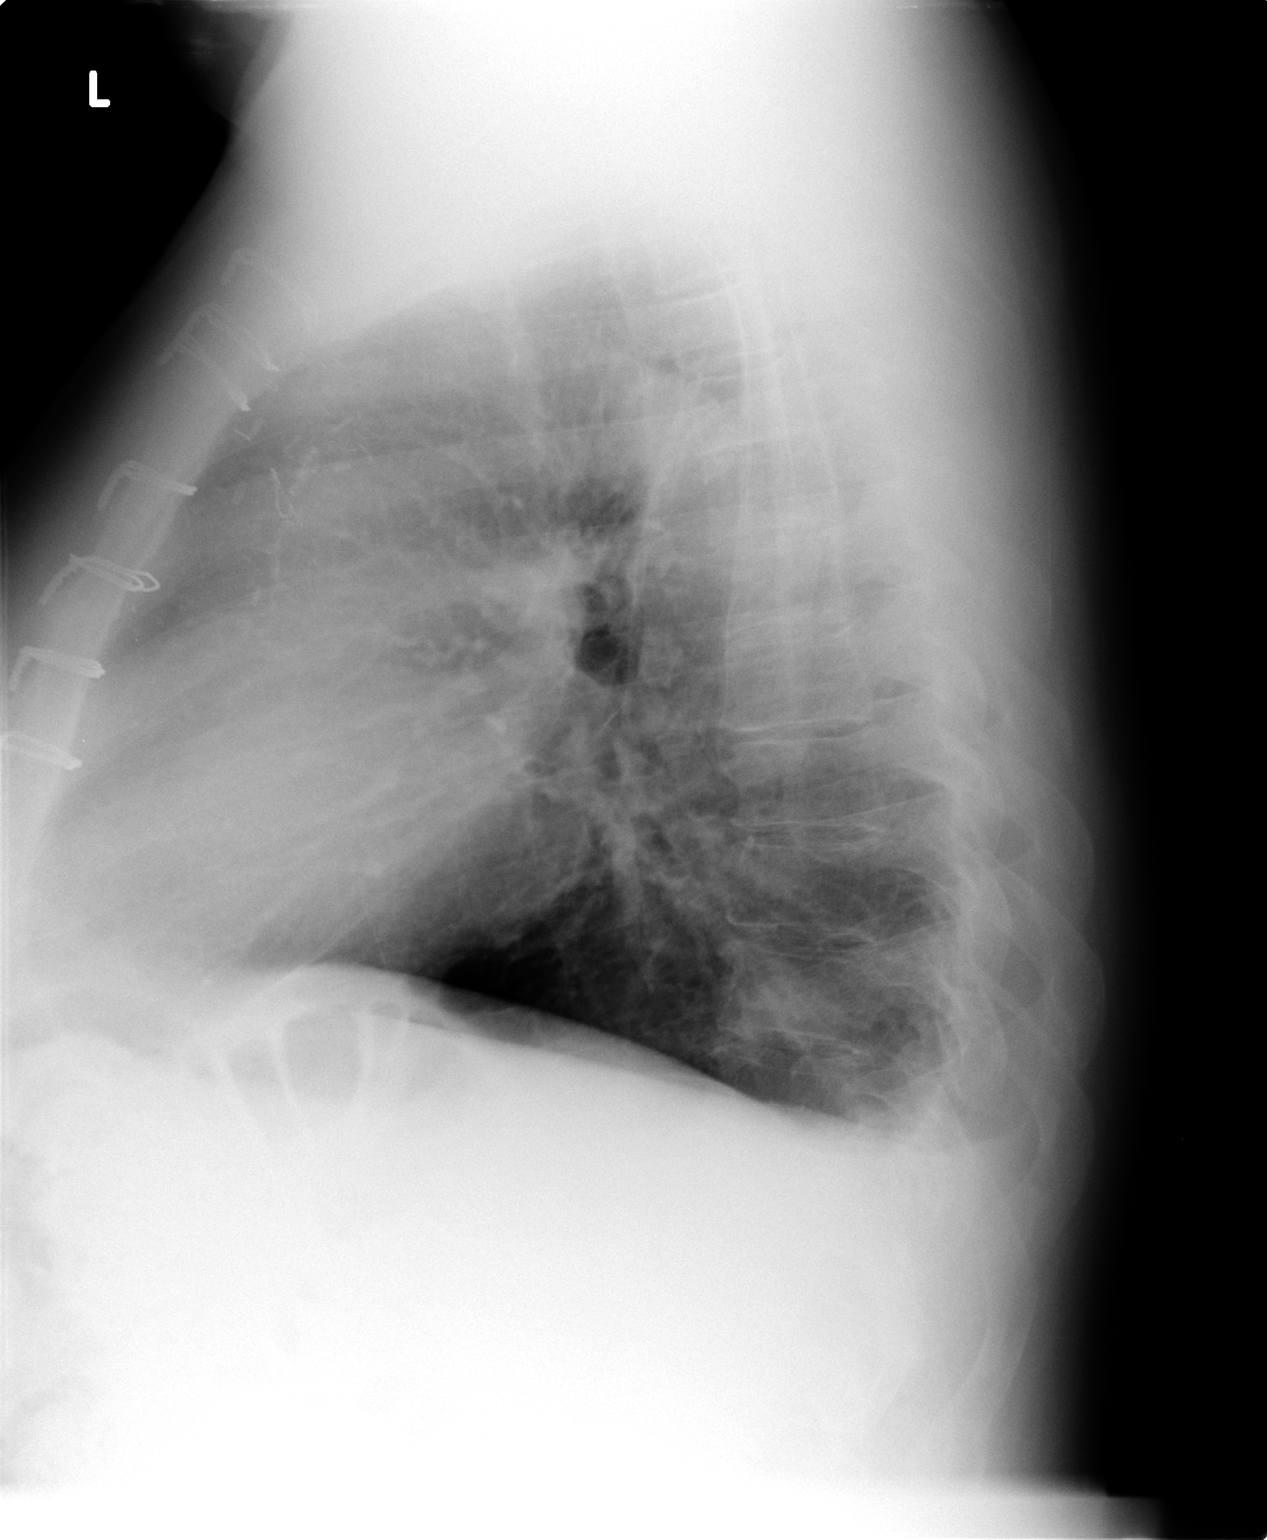

[2 of 2 positions shown; findings below may reference images not displayed]

FINDINGS: Lungs are clear. No pleural effusion or pneumothorax.

The heart is top normal in size. Postsurgical changes related to
prior CABG.

Mild degenerative changes of the visualized thoracolumbar spine.
IMPRESSION: No evidence of acute cardiopulmonary disease.

## 2013-01-28 ENCOUNTER — Ambulatory Visit (INDEPENDENT_AMBULATORY_CARE_PROVIDER_SITE_OTHER): Payer: Medicare Other | Admitting: Cardiology

## 2013-01-28 ENCOUNTER — Encounter: Payer: Self-pay | Admitting: Cardiology

## 2013-01-28 VITALS — BP 141/88 | HR 65 | Ht 71.0 in | Wt 285.0 lb

## 2013-01-28 DIAGNOSIS — Z951 Presence of aortocoronary bypass graft: Secondary | ICD-10-CM

## 2013-01-28 DIAGNOSIS — I2510 Atherosclerotic heart disease of native coronary artery without angina pectoris: Secondary | ICD-10-CM

## 2013-01-28 DIAGNOSIS — E785 Hyperlipidemia, unspecified: Secondary | ICD-10-CM

## 2013-01-28 DIAGNOSIS — Z720 Tobacco use: Secondary | ICD-10-CM

## 2013-01-28 DIAGNOSIS — E669 Obesity, unspecified: Secondary | ICD-10-CM

## 2013-01-28 DIAGNOSIS — F17200 Nicotine dependence, unspecified, uncomplicated: Secondary | ICD-10-CM

## 2013-01-28 DIAGNOSIS — E119 Type 2 diabetes mellitus without complications: Secondary | ICD-10-CM

## 2013-01-28 DIAGNOSIS — I10 Essential (primary) hypertension: Secondary | ICD-10-CM

## 2013-01-28 MED ORDER — METFORMIN HCL 500 MG PO TABS: 500.0000 mg | ORAL_TABLET | Freq: Every day | ORAL | Status: DC

## 2013-01-28 NOTE — Progress Notes (Signed)
HPI The patient presents as a new patient for me.  He has a history of CABG and was seeing Dr. Riley Kill.  Since he was last seen he has done well.  The patient denies any new symptoms such as chest discomfort, neck or arm discomfort. There has been no new shortness of breath, PND or orthopnea. There have been no reported palpitations, presyncope or syncope.  He does some heavy work but doesn't exercise. He is disgusted because he is not losing weight.  He is thinking about weight loss surgery.   Allergies  Allergen Reactions  . Lisinopril Swelling    Swollen tongue   . Penicillins Hives and Rash    Current Outpatient Prescriptions  Medication Sig Dispense Refill  . acyclovir (ZOVIRAX) 200 MG capsule Take 400 mg by mouth 3 (three) times daily as needed. For cold sores      . amLODipine (NORVASC) 10 MG tablet Take 1 tablet (10 mg total) by mouth daily.  30 tablet  6  . aspirin 81 MG tablet Take 81 mg by mouth daily.      Marland Kitchen atorvastatin (LIPITOR) 40 MG tablet Take 1 tablet (40 mg total) by mouth daily.  90 tablet  3  . carvedilol (COREG) 12.5 MG tablet Take 1 tablet (12.5 mg total) by mouth 2 (two) times daily with a meal.  60 tablet  6  . metFORMIN (GLUCOPHAGE) 500 MG tablet Take 1 tablet (500 mg total) by mouth daily with breakfast.  30 tablet  0  . Multiple Vitamin (MULITIVITAMIN WITH MINERALS) TABS Take 1 tablet by mouth daily.       No current facility-administered medications for this visit.    Past Medical History  Diagnosis Date  . HTN (hypertension)   . Diabetes mellitus type 2, noninsulin dependent     a. diagnosed 2012  . Coronary artery disease   . Myocardial infarction   . Diabetes mellitus   . GERD (gastroesophageal reflux disease)   . Atrial fibrillation 12/28/2011  . 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 TOBACCO ABUSE 04/14/2008    Past Surgical History  Procedure Laterality Date  . Cardiac catheterization      12-01-2011  . Left arm surgery as a child    .  Coronary artery bypass graft  12/13/2011    Procedure: CORONARY ARTERY BYPASS GRAFTING (CABG);  Surgeon: Alleen Borne, MD;  Location: [facility] OR;  Service: Open Heart Surgery;  Laterality: N/A;  Times five, on pump, using endoscopically harvested right greater saphenous vein and left internal mammary artery.     ROS:  As stated in the HPI and negative for all other systems.  PHYSICAL EXAM BP 141/88  Pulse 65  Ht 5\' 11"  (1.803 m)  Wt 285 lb (129.275 kg)  BMI 39.77 kg/m2 GENERAL:  Well appearing HEENT:  Pupils equal round and reactive, fundi not visualized, oral mucosa unremarkable NECK:  No jugular venous distention, waveform within normal limits, carotid upstroke brisk and symmetric, no bruits, no thyromegaly LYMPHATICS:  No cervical, inguinal adenopathy LUNGS:  Clear to auscultation bilaterally BACK:  No CVA tenderness CHEST:  Well healed sternotomy scar. HEART:  PMI not displaced or sustained,S1 and S2 within normal limits, no S3, no S4, no clicks, no rubs, no murmurs ABD:  Flat, positive bowel sounds normal in frequency in pitch, no bruits, no rebound, no guarding, no midline pulsatile mass, no hepatomegaly, no splenomegaly EXT:  2 plus pulses throughout, no edema, no cyanosis no clubbing SKIN:  No  rashes no nodules NEURO:  Cranial nerves II through XII grossly intact, motor grossly intact throughout PSYCH:  Cognitively intact, oriented to person place and time   ASSESSMENT AND PLAN  CAD:  The patient has no new sypmtoms.  No further cardiovascular testing is indicated.  We will continue with aggressive risk reduction and meds as listed.  OBESITY:  He inquired about her surgical weight loss program. We will give him information on this. We had a long discussion about diet and exercise.  DYSLIPIDEMIA:  He had an excellent lipid profile in August of last year with an LDL of 43 with an HDL of 47. I have put him in for a follow up lipid profile this August with also an A1C since he has  not had one of these in awhile.  DIABETES:  He did ask for metformin refills and I have written these.  He needs to follow with his primary MD.    HTN:  His BP at home is OK. I have encouraged weight loss rather than adding another agent to get more consistently to target.    CAROTID STENOSIS:  This was mild prior to CABG and can be followed up in several years.

## 2013-01-28 NOTE — Patient Instructions (Addendum)
The current medical regimen is effective;  continue present plan and medications.  Please return fasting for blood work. (Lipid and HA1C)  Follow up in 1 year with Dr Antoine Poche.  You will receive a letter in the mail 2 months before you are due.  Please call us when you receive this letter to schedule your follow up appointment.   Bariatric Surgery (Gastrointestinal Surgery for Severe Obesity) Severe obesity is a longstanding condition. It is difficult to treat through diet and exercise alone. Gastrointestinal surgery is the best option for people who are severely obese and cannot lose weight by traditional means, or who suffer from serious obesity-related health problems. The surgery promotes weight loss by decreasing the absorption of food and, in some operations, interrupting the digestive process. As in other treatments for obesity, the best results are achieved with healthy eating behaviors and regular physical activity.  People who may consider gastrointestinal surgery include those with a body mass index (BMI) above 40. This is about 100 pounds of overweight for men and 80 pounds for women. People with a BMI between 35 and 40 and who suffer from type 2 diabetes or life-threatening cardiopulmonary (heart and lung) problems, such as severe sleep apnea or obesity-related heart disease, may also be candidates for surgery. (To use the Body Mass Index chart. find your weight on the bottom of the graph. Go straight up from that point until you come to the line that matches your height. Then look to find your weight group). The idea of gastrointestinal surgery to control obesity grew out of results of operations for cancer or severe ulcers that removed large portions of the stomach or small intestine. Patients undergoing these procedures tended to lose weight after surgery. So some physicians began to use such operations to treat severe obesity. The first operation that was widely used for severe obesity was  the intestinal bypass. This operation was first used 40 years ago. It produced weight loss by causing malabsorption. The idea was that patients could eat large amounts of food, which would be poorly digested or passed along too fast for the body to absorb many calories. The problem with this surgery was that it caused a loss of essential nutrients. Also, its side effects were unpredictable and sometimes fatal. The original form of the intestinal bypass operation is no longer used. THE NORMAL DIGESTIVE PROCESS Normally, as food moves along the digestive tract, digestive juices and enzymes digest and absorb calories and nutrients. After we chew and swallow our food, it moves down the esophagus to the stomach. There a strong acid continues the digestive process. The stomach can hold about 3 pints of food at one time. When the stomach contents move to the first portion of the small intestine (duodenum ), bile and pancreatic juice speed up digestion. Most of the iron and calcium in the foods we eat is absorbed in the duodenum. The jejunum and ileum are the remaining two segments of the nearly 20 feet of small intestine. They complete the absorption of almost all calories and nutrients. The food particles that cannot be digested in the small intestine are stored in the large intestine until eliminated.  HOW DOES SURGERY PROMOTE WEIGHT LOSS? Gastrointestinal surgery for obesity is also called bariatric surgery. It alters the digestive process. The operations promote weight loss by closing off parts of the stomach. This will make it smaller. Operations that only reduce stomach size are known as "restrictive operations". They restrict the amount of food the stomach  can hold. Some operations combine stomach restriction with a partial bypass of the small intestine. These procedures create a direct connection from the stomach to the lower segment of the small intestine. This causes bypassing portions of the digestive tract  that absorb calories and nutrients. These are known as malabsorptive operations. WHAT ARE THE SURGICAL OPTIONS? There are several types of restrictive and malabsorptive operations. Each one carries its own benefits and risks.  Restrictive Operations  Restrictive operations serve only to restrict food intake. They do not interfere with the normal digestive process. To perform the surgery, doctors create a small pouch at the top of the stomach where food enters from the esophagus. At first, the pouch holds about 1 ounce of food. It later expands to 2-3 ounces. The lower outlet of the pouch usually has a diameter of only about  inch. This small outlet delays the emptying of food from the pouch and causes a feeling of fullness. As a result of this surgery, most people lose the ability to eat large amounts of food at one time. After an operation, the person usually can eat only  to 1 cup of food without discomfort or nausea. Also, food has to be well chewed. Restrictive operations for obesity include adjustable gastric banding (AGB) and vertical banded gastroplasty (VBG).  Adjustable gastric banding  In this procedure, a hollow band made of special material is placed around the stomach near its upper end. This creates a small pouch and a narrow passage into the larger remainder of the stomach. The band is then inflated with a salt solution. It can be tightened or loosened over time to change the size of the passage by increasing or decreasing the amount of salt solution.  The band is adjusted based on feelings of hunger and weight loss. Patients decide when they need an adjustment and come to their surgeons to evaluate this. The adjustment is done as an office visit. The band is fully reversible with a second surgery if the patient changes his/her mind. There is no cutting or re-routing of the intestine.  Vertical banded gastroplasty  VBG has been the most common restrictive operation for weight control.  Both a band and staples are used to create a small stomach pouch. Vertical banded gastroplasty is based on the same principle of restriction as the band. But the stomach is surgically altered with the stapling. This treatment is not reversible.  Restrictive operations lead to weight loss in almost all patients. But they are less successful than malabsorptive operations in achieving substantial, long-term weight loss. About 30 percent of those who undergo VBG achieve normal weight. About 80 percent achieve some degree of weight loss. Some patients regain weight. Others are unable to adjust their eating habits and fail to lose the desired weight. Successful results depend on the patient's willingness to adopt a long-term plan of healthy eating and regular physical activity.  A common risk of restrictive operations is vomiting. This is caused when the small stomach is overly stretched by food particles that have not been chewed well. Band slippage and saline leakage have been reported after AGB. Risks of VBG include wearing away of the band and breakdown of the staple line. In a small number of cases, stomach juices may leak into the abdomen. This requires an emergency operation. In less than 1 percent of all cases, infection or death from complications may occur. Malabsorptive Operations  Malabsorptive operations are the most common gastrointestinal surgeries for weight loss. They restrict  both food intake and the amount of calories and nutrients the body absorbs.  Roux-en-Y gastric bypass (RGB)  This operation is the most common and successful malabsorptive surgery. First, a small stomach pouch is created to restrict food intake. Next, a Y-shaped section of the small intestine is attached to the pouch. This allows food to bypass the lower stomach, the first segment of the small intestine (duodenum), and the first portion of the jejunum (the second segment of the small intestine). This bypass reduces the  amount of calories and nutrients the body absorbs.  Biliopancreatic diversion (BPD)  In this more complicated malabsorptive operation, portions of the stomach are removed. The small pouch that remains is connected directly to the final segment of the small intestine, completely bypassing the duodenum and the jejunum. This procedure successfully promotes weight loss. But it is less frequently used than other types of surgery because of the high risk for nutritional deficiencies. A variation of BPD includes a "duodenal switch". This leaves a larger portion of the stomach intact, including the pyloric valve. This valve regulates the release of stomach contents into the small intestine. It also keeps a small part of the duodenum in the digestive pathway.  Malabsorptive operations produce more weight loss than restrictive operations. And they are more effective in reversing the health problems associated with severe obesity. Patients who have malabsorptive operations generally lose two-thirds of their excess weight within 2 years.  In addition to the risks of restrictive surgeries, malabsorptive operations also carry greater risk for nutritional deficiencies. This is because the procedure causes food to bypass the duodenum and jejunum. That is where most iron and calcium are absorbed. Menstruating women may develop anemia because not enough vitamin B12 and iron are absorbed. Decreased absorption of calcium may also bring on osteoporosis and metabolic bone disease. Patients are required to take nutritional supplements that usually prevent these deficiencies. Patients who have the biliopancreatic diversion surgery must also take fat-soluble (dissolved by fat) vitamins A, D, E, and K supplements.  RGB and BPD operations may also cause "dumping syndrome". This means that stomach contents move too rapidly through the small intestine. Symptoms include nausea, weakness, sweating, faintness, and sometimes diarrhea after  eating. The duodenal switch operation keeps the pyloric valve intact. So it may reduce the likelihood of dumping syndrome.  The more extensive the bypass, the greater the risk is for complications and nutritional deficiencies. Patients with extensive bypasses of the normal digestive process require close monitoring. They also need life-long use of special foods, supplements, and medications. EXPLORE BENEFITS AND RISKS Surgery to produce weight loss is a serious undertaking. Anyone thinking about surgery should understand what the operation involves. Patients and physicians should carefully consider the following benefits and risks.  Benefits  Right after surgery, most patients lose weight quickly. They continue to lose for 18 to 24 months after the procedure. Most patients regain 5 to 10 percent of the weight they lost. But many maintain a long-term weight loss of about 100 pounds.  Surgery improves most obesity-related conditions. For example, in one study blood sugar levels of 83 percent of obese patients with diabetes returned to normal after surgery. Nearly all patients whose blood sugar levels did not return to normal were older. Or they had lived with diabetes for a long time. Risks  Ten to 20 percent of patients who have weight-loss surgery require follow-up operations to correct complications. Abdominal hernia was the most common complication requiring follow-up surgery. But laparoscopic techniques seem  to have solved this problem. In laparoscopy, the surgeon makes one or more small incisions. Slender surgical instruments are passed them. This technique eliminates the need for a large incision. And it creates less tissue damage. Patients who are super obese (greater than 350 pounds) or have had previous abdominal surgery, may not be good candidates for laparoscopy. Less common complications include breakdown of the staple line and stretched stomach outlets.  Some obese patients who have  weight-loss surgery develop gallstones. These are clumps of cholesterol and other matter that form in the gallbladder. During quick or substantial weight loss, one's risk of developing gallstones increases. Taking supplemental bile salts for the first 6 months after surgery can prevent them.  Nearly 30 percent of patients who have weight-loss surgery develop nutritional deficiencies. These include anemia, osteoporosis, and metabolic bone disease. These usually can be avoided if vitamin and mineral intakes are high enough.  Women of childbearing age should avoid pregnancy until their weight becomes stable. Quick weight loss and nutritional deficiencies can harm a growing fetus.  Other risks of restrictive surgeries include:  Band slippage.  Stomach prolapse.  Band erosion into the lumen of the stomach.  Port infection.  The main risk with malabsorption operations is life threatening. It is the risk of leak from any of the anastomosis. The more involved the operation, the more risk involved.  There is one other risk of having the surgery. If people do not follow a strict diet, they will stretch out their stomach pouches. Then they will not lose weight. MEDICAL COSTS Gastrointestinal surgery costs vary. They depend on the procedure. Medical insurance coverage varies by state and insurance provider. If you are considering gastrointestinal surgery, contact your r egional Medicare or Medicaid office or your insurance plan. Find out from them if the procedure is covered. IS THE SURGERY FOR YOU?  Gastrointestinal surgery may be the next step for people who remain severely obese after trying nonsurgical approaches or have an obesity-related disease. Candidates for surgery have:  A BMI of 40 or more.  A BMI of 35 or more and a life-threatening obesity-related health problem such as:  Diabetes.  Severe sleep apnea.  Heart disease.  Obesity-related physical problems that interfere  with:  Employment.  Walking.  Family function. If you fit the profile for surgery, answers to these questions may help you decide whether weight-loss surgery is appropriate for you. Are you:  Unlikely to lose weight successfully without surgery?  Well informed about the surgical procedure? The effects of treatment?  Determined to lose weight? Improve your health?  Aware of how your life may change after the operation? Adjustment to the side effects of the surgery include the need to chew well and being unable to eat large meals.  Aware of the potential for serious complications? Dietary restrictions? Occasional failures?  Committed to lifelong medical follow-up?  Restrictive operations are very successful with patients who follow a diet created by a dietician. Support groups and follow up with caregivers is important. Remember: There are no guarantees for any method to produce and maintain weight loss. This includes surgery. Success is possible only with:  Maximum cooperation.  Commitment to behavioral change.  Medical follow-up. This cooperation and commitment must be carried out for the rest of your life.  ADDITIONAL RESOURCES [employer] [address], [address] [place], [place] [address] www.asmbs.org  Weight-control Information Network (WIN) [address] [place] [address] [employer]nl Document Released: 08/21/2005 Document Revised:  11/13/2011 Document Reviewed: 11/14/2006 Exit[employer] Patient Information 2014 [place], [place].

## 2013-03-14 ENCOUNTER — Other Ambulatory Visit (INDEPENDENT_AMBULATORY_CARE_PROVIDER_SITE_OTHER): Payer: Medicare Other

## 2013-03-14 ENCOUNTER — Other Ambulatory Visit: Payer: Medicare Other

## 2013-03-14 DIAGNOSIS — E785 Hyperlipidemia, unspecified: Secondary | ICD-10-CM

## 2013-03-14 DIAGNOSIS — E119 Type 2 diabetes mellitus without complications: Secondary | ICD-10-CM

## 2013-03-14 LAB — LIPID PANEL
Cholesterol: 129 mg/dL (ref 0–200)
HDL: 36 mg/dL — ABNORMAL LOW (ref >39.00)
VLDL: 34.2 mg/dL (ref 0.0–40.0)

## 2013-03-14 LAB — HEMOGLOBIN A1C: Hgb A1c MFr Bld: 6.8 % — ABNORMAL HIGH (ref 4.6–6.5)

## 2013-03-17 ENCOUNTER — Encounter: Payer: Self-pay | Admitting: *Deleted

## 2013-03-31 ENCOUNTER — Ambulatory Visit: Payer: Medicare Other | Admitting: Family

## 2013-04-02 ENCOUNTER — Ambulatory Visit (INDEPENDENT_AMBULATORY_CARE_PROVIDER_SITE_OTHER): Payer: Medicare Other | Admitting: Family

## 2013-04-02 ENCOUNTER — Encounter: Payer: Self-pay | Admitting: Family

## 2013-04-02 VITALS — BP 124/86 | HR 68 | Wt 291.0 lb

## 2013-04-02 DIAGNOSIS — M109 Gout, unspecified: Secondary | ICD-10-CM

## 2013-04-02 DIAGNOSIS — E119 Type 2 diabetes mellitus without complications: Secondary | ICD-10-CM

## 2013-04-02 MED ORDER — COLCHICINE 0.6 MG PO TABS: 0.6000 mg | ORAL_TABLET | Freq: Every day | ORAL | Status: DC

## 2013-04-02 MED ORDER — ACYCLOVIR 200 MG PO CAPS: 400.0000 mg | ORAL_CAPSULE | Freq: Three times a day (TID) | ORAL | Status: DC | PRN

## 2013-04-02 NOTE — Patient Instructions (Addendum)

## 2013-04-02 NOTE — Progress Notes (Signed)
Subjective:    Patient ID: Daniel [name], male    DOB: 17-Nov-1945, 67 y.o.   MRN: [id number]  HPI 67 year old male, nonsmoker is in for recheck of type 2 diabetes, hypertension, hyperlipidemia, obesity. He is currently doing well. He has a history of myocardial infarction in 2013 well since then. He has been having difficulty losing weight and is considering a bariatric procedure.   Review of Systems  Constitutional: Negative.   HENT: Negative.   Eyes: Negative.   Respiratory: Negative.   Cardiovascular: Negative.   Gastrointestinal: Negative.   Endocrine: Negative.   Genitourinary: Negative.   Musculoskeletal: Negative.   Skin: Negative.   Allergic/Immunologic: Negative.   Neurological: Negative.   Hematological: Negative.   Psychiatric/Behavioral: Negative.    Past Medical History  Diagnosis Date  . HTN (hypertension)   . Diabetes mellitus type 2, noninsulin dependent     a. diagnosed 2012  . Coronary artery disease   . Myocardial infarction   . Diabetes mellitus   . GERD (gastroesophageal reflux disease)   . Atrial fibrillation 12/28/2011  . 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 TOBACCO ABUSE 04/14/2008    History   Social History  . Marital Status: Widowed    Spouse Name: N/A    Number of Children: N/A  . Years of Education: N/A   Occupational History  . Not on file.   Social History Main Topics  . Smoking status: Former Smoker -- 2 years    Types: Cigars  . Smokeless tobacco: Not on file     Comment: smokes 1 cigar/day for many years  . Alcohol Use: No  . Drug Use: No  . Sexually Active: Not Currently   Other Topics Concern  . Not on file   Social History Narrative   Pt lives by himself in [place].  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  He does not routinely exercise or adhere to any particular diet.    Past Surgical History  Procedure Laterality Date  . Cardiac catheterization      12-01-2011  . Left arm surgery as a child    . Coronary artery  bypass graft  12/13/2011    Procedure: CORONARY ARTERY BYPASS GRAFTING (CABG);  Surgeon: Alleen Borne, MD;  Location: [facility];  Service: Open Heart Surgery;  Laterality: N/A;  Times five, on pump, using endoscopically harvested right greater saphenous vein and left internal mammary artery.     Family History  Problem Relation Age of Onset  . Heart attack Father     died @ 50's  . Heart attack Mother     died @ 84's  . Heart attack Brother     died @ 63  . Heart attack Brother     died @ 56  . Anesthesia problems Neg Hx   . Hypotension Neg Hx   . Malignant hyperthermia Neg Hx   . Pseudochol deficiency Neg Hx     Allergies  Allergen Reactions  . Lisinopril Swelling    Swollen tongue   . Penicillins Hives and Rash    Current Outpatient Prescriptions on File Prior to Visit  Medication Sig Dispense Refill  . amLODipine (NORVASC) 10 MG tablet Take 1 tablet (10 mg total) by mouth daily.  30 tablet  6  . aspirin 81 MG tablet Take 81 mg by mouth daily.      Marland Kitchen atorvastatin (LIPITOR) 40 MG tablet Take 1 tablet (40 mg total) by mouth daily.  90 tablet  3  .  carvedilol (COREG) 12.5 MG tablet Take 1 tablet (12.5 mg total) by mouth 2 (two) times daily with a meal.  60 tablet  6  . metFORMIN (GLUCOPHAGE) 500 MG tablet Take 1 tablet (500 mg total) by mouth daily with breakfast.  90 tablet  3  . Multiple Vitamin (MULITIVITAMIN WITH MINERALS) TABS Take 1 tablet by mouth daily.       No current facility-administered medications on file prior to visit.    BP 124/86  Pulse 68  Wt 291 lb (131.997 kg)  BMI 40.6 kg/m2  SpO2 98%chart    Objective:   Physical Exam  Constitutional: He is oriented to person, place, and time. He appears well-developed and well-nourished.  HENT:  Right Ear: External ear normal.  Left Ear: External ear normal.  Nose: Nose normal.  Mouth/Throat: Oropharynx is clear and moist.  Neck: Normal range of motion. Neck supple.  Cardiovascular: Normal rate, regular rhythm  and normal heart sounds.   Pulmonary/Chest: Effort normal and breath sounds normal.  Abdominal: Soft. Bowel sounds are normal.  Musculoskeletal: Normal range of motion.  Neurological: He is alert and oriented to person, place, and time.  Skin: Skin is warm and dry.  Psychiatric: He has a normal mood and affect.          Assessment & Plan:   assessment: 1. Type 2 diabetes 2. Hypertension 3. Hyperlipidemia 4. Obesity  Plan: Continue current medications. Recheck for a complete physical exam in 3 months. Follow cardiology as scheduled. Exercise 30 minutes at least 4 days a week.

## 2013-06-15 ENCOUNTER — Other Ambulatory Visit: Payer: Self-pay | Admitting: Cardiology

## 2013-07-16 ENCOUNTER — Other Ambulatory Visit: Payer: Self-pay

## 2013-07-16 MED ORDER — AMLODIPINE BESYLATE 10 MG PO TABS: 10.0000 mg | ORAL_TABLET | Freq: Every day | ORAL | Status: DC

## 2013-10-14 ENCOUNTER — Other Ambulatory Visit: Payer: Self-pay | Admitting: Cardiology

## 2013-12-21 ENCOUNTER — Other Ambulatory Visit: Payer: Self-pay | Admitting: Cardiology

## 2013-12-24 ENCOUNTER — Other Ambulatory Visit: Payer: Self-pay | Admitting: Family

## 2014-01-10 ENCOUNTER — Other Ambulatory Visit: Payer: Self-pay | Admitting: Cardiology

## 2014-01-29 ENCOUNTER — Ambulatory Visit: Payer: Medicare Other | Admitting: Cardiology

## 2014-02-06 ENCOUNTER — Encounter: Payer: Self-pay | Admitting: Cardiology

## 2014-02-07 ENCOUNTER — Other Ambulatory Visit: Payer: Self-pay | Admitting: Cardiology

## 2014-02-12 ENCOUNTER — Other Ambulatory Visit: Payer: Self-pay | Admitting: Family

## 2014-02-13 ENCOUNTER — Ambulatory Visit (INDEPENDENT_AMBULATORY_CARE_PROVIDER_SITE_OTHER): Payer: Medicare Other | Admitting: Cardiology

## 2014-02-13 ENCOUNTER — Encounter: Payer: Self-pay | Admitting: Cardiology

## 2014-02-13 VITALS — BP 124/82 | HR 67 | Ht 71.0 in | Wt 258.0 lb

## 2014-02-13 DIAGNOSIS — I2510 Atherosclerotic heart disease of native coronary artery without angina pectoris: Secondary | ICD-10-CM

## 2014-02-13 DIAGNOSIS — E7800 Pure hypercholesterolemia, unspecified: Secondary | ICD-10-CM

## 2014-02-13 DIAGNOSIS — I10 Essential (primary) hypertension: Secondary | ICD-10-CM

## 2014-02-13 DIAGNOSIS — I4891 Unspecified atrial fibrillation: Secondary | ICD-10-CM

## 2014-02-13 MED ORDER — METFORMIN HCL 500 MG PO TABS: 500.0000 mg | ORAL_TABLET | Freq: Every day | ORAL | Status: DC

## 2014-02-13 NOTE — Progress Notes (Signed)
HPI The patient presents for follow up of CAD/CABG.  Since he was last seen he has done well.  The patient denies any new symptoms such as chest discomfort, neck or arm discomfort. There has been no new shortness of breath, PND or orthopnea. There have been no reported palpitations, presyncope or syncope. He walks daily.   He has been successful in losing weight!  Allergies  Allergen Reactions  . Lisinopril Swelling    Swollen tongue   . Penicillins Hives and Rash    Current Outpatient Prescriptions  Medication Sig Dispense Refill  . acyclovir (ZOVIRAX) 200 MG capsule Take 2 capsules (400 mg total) by mouth 3 (three) times daily as needed. For cold sores  90 capsule  3  . amLODipine (NORVASC) 10 MG tablet TAKE 1 TABLET (10 MG TOTAL) BY MOUTH DAILY.  30 tablet  0  . aspirin 81 MG tablet Take 81 mg by mouth daily.      Marland Kitchen. atorvastatin (LIPITOR) 40 MG tablet TAKE 1 TABLET BY MOUTH DAILY  30 tablet  1  . carvedilol (COREG) 12.5 MG tablet TAKE 1 TABLET (12.5 MG TOTAL) BY MOUTH 2 (TWO) TIMES DAILY WITH A MEAL.  60 tablet  2  . metFORMIN (GLUCOPHAGE) 500 MG tablet TAKE 1 TABLET (500 MG TOTAL) BY MOUTH DAILY WITH BREAKFAST.  30 tablet  0  . Multiple Vitamin (MULITIVITAMIN WITH MINERALS) TABS Take 1 tablet by mouth daily.       No current facility-administered medications for this visit.    Past Medical History  Diagnosis Date  . HTN (hypertension)   . Diabetes mellitus type 2, noninsulin dependent     a. diagnosed 2012  . Coronary artery disease   . Myocardial infarction   . Diabetes mellitus   . GERD (gastroesophageal reflux disease)   . Atrial fibrillation 12/28/2011  . 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 TOBACCO ABUSE 04/14/2008    Past Surgical History  Procedure Laterality Date  . Cardiac catheterization      12-01-2011  . Left arm surgery as a child    . Coronary artery bypass graft  12/13/2011    Procedure: CORONARY ARTERY BYPASS GRAFTING (CABG);  Surgeon: Alleen BorneBryan K Bartle,  MD;  [facility] OR;  Service: Open Heart Surgery;  Laterality: N/A;  Times five, on pump, using endoscopically harvested right greater saphenous vein and left internal mammary artery.     ROS:  As stated in the HPI and negative for all other systems.  PHYSICAL EXAM BP 124/82  Pulse 67  Ht 5\' 11"  (1.803 m)  Wt 258 lb (117.028 kg)  BMI 36.00 kg/m2 GENERAL:  Well appearing NECK:  No jugular venous distention, waveform within normal limits, carotid upstroke brisk and symmetric, no bruits, no thyromegaly LUNGS:  Clear to auscultation bilaterally BACK:  No CVA tenderness CHEST:  Well healed sternotomy scar. HEART:  PMI not displaced or sustained,S1 and S2 within normal limits, no S3, no S4, no clicks, no rubs, no murmurs ABD:  Flat, positive bowel sounds normal in frequency in pitch, no bruits, no rebound, no guarding, no midline pulsatile mass, no hepatomegaly, no splenomegaly EXT:  2 plus pulses throughout, trace edema, no cyanosis no clubbing  EKG:  Sinus rhythm, rate 67, axis within normal limits, intervals within normal limits, old inferior infarct, no acute ST-T wave changes.  02/13/2014   ASSESSMENT AND PLAN  CAD:  The patient has no new sypmtoms.  No further cardiovascular testing is indicated.  We will  continue with aggressive risk reduction and meds as listed.  OBESITY:  I am proud of his weight loss.  He will continue the same.   DYSLIPIDEMIA:  He had an excellent lipid profile in Sept of last year with an LDL of 59with an HDL of 36.  He can have these repeated in September.  DIABETES:  This is well controlled.  No change in therapy.   HTN:  The blood pressure is at target. No change in medications is indicated. We will continue with therapeutic lifestyle changes (TLC).

## 2014-02-13 NOTE — Patient Instructions (Addendum)
The current medical regimen is effective;  continue present plan and medications.  Please have fasting lipid panel in September.  Follow up in 1 year with Dr Antoine PocheHochrein.  You will receive a letter in the mail 2 months before you are due.  Please call us when you receive this letter to schedule your follow up appointment.

## 2014-03-08 ENCOUNTER — Other Ambulatory Visit: Payer: Self-pay | Admitting: Cardiology

## 2014-04-07 ENCOUNTER — Other Ambulatory Visit: Payer: Self-pay | Admitting: Cardiology

## 2014-04-14 ENCOUNTER — Ambulatory Visit (INDEPENDENT_AMBULATORY_CARE_PROVIDER_SITE_OTHER): Payer: Medicare Other | Admitting: Family Medicine

## 2014-04-14 ENCOUNTER — Encounter: Payer: Self-pay | Admitting: Family Medicine

## 2014-04-14 VITALS — BP 122/80 | HR 72 | Temp 98.3°F | Ht 70.5 in | Wt 252.0 lb

## 2014-04-14 DIAGNOSIS — K047 Periapical abscess without sinus: Secondary | ICD-10-CM

## 2014-04-14 DIAGNOSIS — J069 Acute upper respiratory infection, unspecified: Secondary | ICD-10-CM

## 2014-04-14 MED ORDER — CLINDAMYCIN HCL 300 MG PO CAPS: 300.0000 mg | ORAL_CAPSULE | Freq: Three times a day (TID) | ORAL | Status: DC

## 2014-04-14 MED ORDER — COLCHICINE 0.6 MG PO TABS: 0.6000 mg | ORAL_TABLET | Freq: Every day | ORAL | Status: DC

## 2014-04-14 NOTE — Progress Notes (Signed)
Tana Conch, MD Phone: [phone]  Subjective:   Daniel [name] is a 68 y.o. year old very pleasant male patient who presents with the following:  Sore throat/cough/runny eyes X 3-4 days. Slowly getting worse. Tried delsym with little help. Staying active, not resting other than at nighttime. Single hydrated. Dental abscessWorsening pain for last 2 weeks under left upper tooth after a hair from an anchove got caught under tooth. Does not get regular dental care. Moderate aching pain. No treatment. Admits pain with chewing.  ROS- No fever/chills some sweating in evenings. No shortness of breath. No chest pain. No palpitations.   Past Medical History- a. Fib after bypass but not persistent, CAD s/p CABG and s/p MI, HLD, former smoker, HTN, DM  Medications- reviewed and updated Current Outpatient Prescriptions  Medication Sig Dispense Refill  . amLODipine (NORVASC) 10 MG tablet TAKE 1 TABLET (10 MG TOTAL) BY MOUTH DAILY.  30 tablet  5  . aspirin 81 MG tablet Take 81 mg by mouth daily.      . carvedilol (COREG) 12.5 MG tablet TAKE 1 TABLET (12.5 MG TOTAL) BY MOUTH 2 (TWO) TIMES DAILY WITH A MEAL.  60 tablet  5  . metFORMIN (GLUCOPHAGE) 500 MG tablet Take 1 tablet (500 mg total) by mouth daily with breakfast.  30 tablet  6  . Multiple Vitamin (MULITIVITAMIN WITH MINERALS) TABS Take 1 tablet by mouth daily.      Marland Kitchen acyclovir (ZOVIRAX) 200 MG capsule Take 2 capsules (400 mg total) by mouth 3 (three) times daily as needed. For cold sores  90 capsule  3  . atorvastatin (LIPITOR) 40 MG tablet TAKE 1 TABLET BY MOUTH DAILY  30 tablet  1  . clindamycin (CLEOCIN) 300 MG capsule Take 1 capsule (300 mg total) by mouth 3 (three) times daily.  21 capsule  0  . colchicine 0.6 MG tablet Take 1 tablet (0.6 mg total) by mouth daily. Please schedule visit with Adline Mango before further refills.  30 tablet  0   No current facility-administered medications for this visit.    Objective: BP  122/80  Pulse 72  Temp(Src) 98.3 F (36.8 C)  Ht 5' 10.5" (1.791 m)  Wt 252 lb (114.306 kg)  BMI 35.64 kg/m2 Gen: NAD, resting comfortably on table HEENT: turbinates erythematous and swollen with some yellow exudate, oropharynx normal without pharyngeal exudate but some cobblestoning, TM normal bilaterally, Mucous membranes are moist. Large amount of plaque buildup. Behind left upper incisor there is a 1 cm x 5-7 mm tender erythematous area.  NEck: no lymphadenopathy, no tonsilar enlargement CV: RRR no murmurs rubs or gallops Lungs: CTAB no crackles, wheeze, rhonchi Ext: 1+ edema (unchanged per patient) Skin: warm, dry, no rash  Assessment/Plan:  Upper Respiratory Infection Advised of symptomatic care  Doubt influenza as afebrile May have allergic component Given reasons for return   Dental abscess Advised patient he needs to follow up with his dentist within 10-14 days. Clindamycin x7 days as penicillin allergic. Discussed that antibiotics are for dental abscess and not URI. His URI may get better but that is simply due to the fact that is the natural time course of disease. Also discussed with patient that he needs to brush twice a day instead of once daily and have regular dental care  I refilled patient's colchicine. I told him he needs to follow up with PCP to discuss control of gout. Meds ordered this encounter  Medications  . colchicine 0.6 MG tablet  Sig: Take 1 tablet (0.6 mg total) by mouth daily. Please schedule visit with Adline MangoPadonda Campbell before further refills.    Dispense:  30 tablet    Refill:  0  . clindamycin (CLEOCIN) 300 MG capsule    Sig: Take 1 capsule (300 mg total) by mouth 3 (three) times daily.    Dispense:  21 capsule    Refill:  0

## 2014-04-14 NOTE — Patient Instructions (Signed)
Upper Respiratory Infection The most important thing is to get a lot of rest and stay well hydrated.  If you develop fevers, worsening of symptoms beyond the time we discussed (10 days), worsening of symptoms after they get better, please schedule a follow up visit or give us a call for advice.   For fever or pain-use tylenol or ibuprofen (if over 6 months) For cough-delsym or mucinex For congestion-try a neti pot and make sure to follow instructions for water You could also try an OTC allergy medicine like zyrtec or allegra  For the abscess Please see your dentist within 10-14 days.  Take antibiotic until completion. Call us if you have to stop.  If you have fevers or worsening pain please seek care sooner

## 2014-04-15 ENCOUNTER — Other Ambulatory Visit: Payer: Self-pay | Admitting: Family

## 2014-05-06 ENCOUNTER — Other Ambulatory Visit: Payer: Self-pay | Admitting: Cardiology

## 2014-08-03 ENCOUNTER — Other Ambulatory Visit: Payer: Self-pay | Admitting: Cardiology

## 2014-08-07 ENCOUNTER — Other Ambulatory Visit: Payer: Self-pay | Admitting: *Deleted

## 2014-08-07 NOTE — Telephone Encounter (Signed)
Already refilled

## 2014-08-13 ENCOUNTER — Encounter (HOSPITAL_COMMUNITY): Payer: Self-pay | Admitting: Cardiology

## 2014-09-07 ENCOUNTER — Other Ambulatory Visit: Payer: Self-pay | Admitting: Cardiology

## 2014-10-06 ENCOUNTER — Other Ambulatory Visit: Payer: Self-pay | Admitting: *Deleted

## 2014-10-06 MED ORDER — AMLODIPINE BESYLATE 10 MG PO TABS: ORAL_TABLET | ORAL | Status: DC

## 2014-10-06 MED ORDER — ATORVASTATIN CALCIUM 40 MG PO TABS: 40.0000 mg | ORAL_TABLET | Freq: Every day | ORAL | Status: DC

## 2014-11-23 ENCOUNTER — Other Ambulatory Visit: Payer: Self-pay | Admitting: Cardiology

## 2014-12-09 ENCOUNTER — Other Ambulatory Visit: Payer: Self-pay

## 2014-12-09 MED ORDER — ATORVASTATIN CALCIUM 40 MG PO TABS: 40.0000 mg | ORAL_TABLET | Freq: Every day | ORAL | Status: DC

## 2015-01-20 ENCOUNTER — Other Ambulatory Visit: Payer: Self-pay | Admitting: Cardiology

## 2015-02-05 ENCOUNTER — Other Ambulatory Visit: Payer: Self-pay | Admitting: Cardiology

## 2015-02-07 NOTE — Telephone Encounter (Signed)
Metformin is prescribed her Eulis FosterPadonda B Webb, FNP.

## 2015-03-16 ENCOUNTER — Other Ambulatory Visit: Payer: Self-pay | Admitting: *Deleted

## 2015-03-16 MED ORDER — CARVEDILOL 12.5 MG PO TABS: 12.5000 mg | ORAL_TABLET | Freq: Two times a day (BID) | ORAL | Status: DC

## 2015-03-18 ENCOUNTER — Other Ambulatory Visit: Payer: Self-pay | Admitting: *Deleted

## 2015-03-18 ENCOUNTER — Other Ambulatory Visit: Payer: Self-pay

## 2015-03-18 MED ORDER — AMLODIPINE BESYLATE 10 MG PO TABS: ORAL_TABLET | ORAL | Status: DC

## 2015-03-18 NOTE — Telephone Encounter (Signed)
OK to refill

## 2015-03-19 MED ORDER — METFORMIN HCL 500 MG PO TABS: 500.0000 mg | ORAL_TABLET | Freq: Every day | ORAL | Status: DC

## 2015-03-19 NOTE — Telephone Encounter (Signed)
Rx(s) sent to pharmacy electronically.  

## 2015-04-06 ENCOUNTER — Other Ambulatory Visit: Payer: Self-pay | Admitting: Cardiology

## 2015-04-06 NOTE — Telephone Encounter (Signed)
Rx(s) sent to pharmacy electronically.  

## 2015-04-13 ENCOUNTER — Other Ambulatory Visit: Payer: Self-pay | Admitting: Cardiology

## 2015-04-13 MED ORDER — CARVEDILOL 12.5 MG PO TABS: 12.5000 mg | ORAL_TABLET | Freq: Two times a day (BID) | ORAL | Status: DC

## 2015-04-28 ENCOUNTER — Other Ambulatory Visit: Payer: Self-pay | Admitting: Cardiology

## 2015-05-06 ENCOUNTER — Other Ambulatory Visit: Payer: Self-pay | Admitting: *Deleted

## 2015-05-06 MED ORDER — ATORVASTATIN CALCIUM 40 MG PO TABS: 40.0000 mg | ORAL_TABLET | Freq: Every day | ORAL | Status: DC

## 2015-05-10 ENCOUNTER — Other Ambulatory Visit: Payer: Self-pay | Admitting: Cardiology

## 2015-05-26 ENCOUNTER — Other Ambulatory Visit: Payer: Self-pay

## 2015-05-26 MED ORDER — AMLODIPINE BESYLATE 10 MG PO TABS: 10.0000 mg | ORAL_TABLET | Freq: Every day | ORAL | Status: DC

## 2015-05-26 MED ORDER — ATORVASTATIN CALCIUM 40 MG PO TABS: 40.0000 mg | ORAL_TABLET | Freq: Every day | ORAL | Status: DC

## 2015-05-26 MED ORDER — CARVEDILOL 12.5 MG PO TABS: 12.5000 mg | ORAL_TABLET | Freq: Two times a day (BID) | ORAL | Status: DC

## 2015-05-26 NOTE — Telephone Encounter (Signed)
Rx(s) sent to pharmacy electronically. Patient is overdue for appointment 

## 2015-05-28 ENCOUNTER — Other Ambulatory Visit: Payer: Self-pay | Admitting: *Deleted

## 2015-05-28 MED ORDER — CARVEDILOL 12.5 MG PO TABS: 12.5000 mg | ORAL_TABLET | Freq: Two times a day (BID) | ORAL | Status: DC

## 2015-05-28 MED ORDER — ATORVASTATIN CALCIUM 40 MG PO TABS: 40.0000 mg | ORAL_TABLET | Freq: Every day | ORAL | Status: DC

## 2015-05-28 MED ORDER — AMLODIPINE BESYLATE 10 MG PO TABS: 10.0000 mg | ORAL_TABLET | Freq: Every day | ORAL | Status: DC

## 2015-06-18 ENCOUNTER — Encounter: Payer: Self-pay | Admitting: *Deleted

## 2015-06-22 ENCOUNTER — Encounter: Payer: Self-pay | Admitting: Cardiology

## 2015-06-22 ENCOUNTER — Ambulatory Visit (INDEPENDENT_AMBULATORY_CARE_PROVIDER_SITE_OTHER): Payer: Medicare Other | Admitting: Cardiology

## 2015-06-22 VITALS — BP 114/68 | HR 58 | Ht 71.0 in | Wt 247.0 lb

## 2015-06-22 DIAGNOSIS — I10 Essential (primary) hypertension: Secondary | ICD-10-CM

## 2015-06-22 DIAGNOSIS — I480 Paroxysmal atrial fibrillation: Secondary | ICD-10-CM

## 2015-06-22 MED ORDER — ATORVASTATIN CALCIUM 40 MG PO TABS: 40.0000 mg | ORAL_TABLET | Freq: Every day | ORAL | Status: DC

## 2015-06-22 MED ORDER — CARVEDILOL 12.5 MG PO TABS: 12.5000 mg | ORAL_TABLET | Freq: Two times a day (BID) | ORAL | Status: DC

## 2015-06-22 MED ORDER — AMLODIPINE BESYLATE 10 MG PO TABS: 10.0000 mg | ORAL_TABLET | Freq: Every day | ORAL | Status: DC

## 2015-06-22 NOTE — Progress Notes (Signed)
HPI The patient presents for follow up of CAD/CABG.  Since he was last seen he has done well.  The patient denies any new symptoms such as chest discomfort, neck or arm discomfort. There has been no new shortness of breath, PND or orthopnea. There have been no reported palpitations, presyncope or syncope. He walks daily.   He has been successful in losing weight and has lost 35 lbs altogether.  He 5 days per week and walks for exercise.   Allergies  Allergen Reactions  . Lisinopril Swelling    Swollen tongue   . Penicillins Hives and Rash    Current Outpatient Prescriptions  Medication Sig Dispense Refill  . acyclovir (ZOVIRAX) 200 MG capsule TAKE 2 CAPSULES (400 MG TOTAL) BY MOUTH 3 (THREE) TIMES DAILY AS NEEDED FOR COLD SORES 90 capsule 2  . amLODipine (NORVASC) 10 MG tablet Take 1 tablet (10 mg total) by mouth daily. 30 tablet 2  . aspirin 81 MG tablet Take 81 mg by mouth daily.    Marland Kitchen atorvastatin (LIPITOR) 40 MG tablet Take 1 tablet (40 mg total) by mouth daily at 6 PM. 30 tablet 2  . carvedilol (COREG) 12.5 MG tablet Take 1 tablet (12.5 mg total) by mouth 2 (two) times daily with a meal. 60 tablet 2  . colchicine 0.6 MG tablet Take 1 tablet (0.6 mg total) by mouth daily. Please schedule visit with Adline Mango before further refills. 30 tablet 0  . glimepiride (AMARYL) 2 MG tablet Take 2 mg by mouth daily with breakfast.    . metFORMIN (GLUCOPHAGE) 500 MG tablet Take 1 tablet (500 mg total) by mouth daily with breakfast. (Patient taking differently: Take 500 mg by mouth 2 (two) times daily with a meal. ) 30 tablet 3  . Multiple Vitamin (MULITIVITAMIN WITH MINERALS) TABS Take 1 tablet by mouth daily.    . Omega-3 Fatty Acids (FISH OIL) 1200 MG CAPS Take 1,200 mg by mouth 3 (three) times daily.     No current facility-administered medications for this visit.    Past Medical History  Diagnosis Date  . HTN (hypertension)   . Diabetes mellitus type 2, noninsulin dependent (HCC)       a. diagnosed 2012  . Coronary artery disease   . Myocardial infarction (HCC)   . Diabetes mellitus   . GERD (gastroesophageal reflux disease)   . Atrial fibrillation (HCC) 12/28/2011  . 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 TOBACCO ABUSE 04/14/2008    Past Surgical History  Procedure Laterality Date  . Cardiac catheterization      12-01-2011  . Left arm surgery as a child    . Coronary artery bypass graft  12/13/2011    Procedure: CORONARY ARTERY BYPASS GRAFTING (CABG);  Surgeon: Alleen Borne, MD;  Location: [facility] OR;  Service: Open Heart Surgery;  Laterality: N/A;  Times five, on pump, using endoscopically harvested right greater saphenous vein and left internal mammary artery.   . Left heart catheterization with coronary angiogram N/A 12/01/2011    Procedure: LEFT HEART CATHETERIZATION WITH CORONARY ANGIOGRAM;  Surgeon: Herby Abraham, MD;  Location[facility] C[facility] LAB;  Service: Cardiovascular;  Laterality: N/A;  . Percutaneous coronary intervention-balloon only N/A 12/01/2011    Procedure: PERCUTANEOUS CORONARY INTERVENTION-BALLOON ONLY;  Surgeon: Herby Abraham, MD;  Location: [facility] CATH LAB;  Service: Cardiovascular;  Laterality: N/A;    ROS:  As stated in the HPI and negative for all other systems.  PHYSICAL EXAM BP 114/68 mmHg  Pulse  58  Ht 5\' 11"  (1.803 m)  Wt 247 lb (112.038 kg)  BMI 34.46 kg/m2 GENERAL:  Well appearing NECK:  No jugular venous distention, waveform within normal limits, carotid upstroke brisk and symmetric, no bruits, no thyromegaly LUNGS:  Clear to auscultation bilaterally BACK:  No CVA tenderness CHEST:  Well healed sternotomy scar. HEART:  PMI not displaced or sustained,S1 and S2 within normal limits, no S3, no S4, no clicks, no rubs, no murmurs ABD:  Flat, positive bowel sounds normal in frequency in pitch, no bruits, no rebound, no guarding, no midline pulsatile mass, no hepatomegaly, no splenomegaly EXT:  2 plus pulses throughout, trace edema, no  cyanosis no clubbing  EKG:  Sinus rhythm, rate 58, axis within normal limits, intervals within normal limits, old inferior infarct, no acute ST-T wave changes.  06/22/2015   ASSESSMENT AND PLAN  CAD:  The patient has no new sypmtoms.  No further cardiovascular testing is indicated.  We will continue with aggressive risk reduction and meds as listed.  OBESITY:  I am proud of his weight loss.  He will continue the same.   DYSLIPIDEMIA:  I will try to get a copy of the most recent lipid profile.    DIABETES:   Per Dr. Soundra PilonGurlach.   HTN:  The blood pressure is at target. No change in medications is indicated. We will continue with therapeutic lifestyle changes (TLC).

## 2015-06-22 NOTE — Patient Instructions (Signed)
Your physician wants you to follow-up in: 1 Year. You will receive a reminder letter in the mail two months in advance. If you don't receive a letter, please call our office to schedule the follow-up appointment.  If you need a refill on your cardiac medications before your next appointment, please call your pharmacy.   

## 2015-07-08 ENCOUNTER — Other Ambulatory Visit: Payer: Self-pay | Admitting: Cardiology

## 2015-07-19 ENCOUNTER — Other Ambulatory Visit: Payer: Self-pay | Admitting: Cardiology

## 2015-07-21 NOTE — Telephone Encounter (Signed)
Rx has been sent to the pharmacy electronically. ° °

## 2015-07-27 ENCOUNTER — Other Ambulatory Visit: Payer: Self-pay

## 2015-07-27 NOTE — Telephone Encounter (Signed)
Medication Detail      Disp Refills Start End     amLODipine (NORVASC) 10 MG tablet 30 tablet 12 06/22/2015     Sig - Route: Take 1 tablet (10 mg total) by mouth daily. - Oral    E-Prescribing Status: Receipt confirmed by pharmacy (06/22/2015 3:21 PM EDT)     Pharmacy    [facility] [place] - [phone]

## 2015-08-18 ENCOUNTER — Other Ambulatory Visit: Payer: Self-pay | Admitting: Cardiology

## 2015-09-16 ENCOUNTER — Other Ambulatory Visit: Payer: Self-pay | Admitting: Cardiology

## 2015-09-17 ENCOUNTER — Other Ambulatory Visit: Payer: Self-pay

## 2015-10-15 ENCOUNTER — Encounter: Payer: Self-pay | Admitting: Gastroenterology

## 2016-04-05 ENCOUNTER — Other Ambulatory Visit: Payer: Self-pay

## 2016-04-05 MED ORDER — GLIMEPIRIDE 2 MG PO TABS: 2.0000 mg | ORAL_TABLET | Freq: Every day | ORAL | 3 refills | Status: DC

## 2016-04-05 MED ORDER — AMLODIPINE BESYLATE 10 MG PO TABS: 10.0000 mg | ORAL_TABLET | Freq: Every day | ORAL | 3 refills | Status: DC

## 2016-04-05 MED ORDER — CARVEDILOL 12.5 MG PO TABS: 12.5000 mg | ORAL_TABLET | Freq: Two times a day (BID) | ORAL | 3 refills | Status: DC

## 2016-04-05 MED ORDER — ATORVASTATIN CALCIUM 40 MG PO TABS: 40.0000 mg | ORAL_TABLET | Freq: Every day | ORAL | 3 refills | Status: DC

## 2016-04-13 ENCOUNTER — Telehealth: Payer: Self-pay | Admitting: *Deleted

## 2016-04-14 NOTE — Telephone Encounter (Signed)
Routed to MD to approve/deny request for metformin refill

## 2016-04-16 NOTE — Telephone Encounter (Signed)
OK to refill metformin x 3 months.  The patient needs to find a primary care MD to follow diabetes and other primary issues.

## 2016-04-17 MED ORDER — METFORMIN HCL 500 MG PO TABS: 500.0000 mg | ORAL_TABLET | Freq: Every day | ORAL | 0 refills | Status: DC

## 2016-04-17 NOTE — Telephone Encounter (Signed)
Rx(s) sent to pharmacy electronically.  

## 2016-06-19 ENCOUNTER — Other Ambulatory Visit: Payer: Self-pay | Admitting: Cardiology

## 2016-06-22 ENCOUNTER — Encounter: Payer: Self-pay | Admitting: Cardiology

## 2016-07-04 NOTE — Progress Notes (Signed)
HPI The patient presents for follow up of CAD/CABG.  Since he was last seen he has done well.  He has lost a total of 50 lbs so far.  He exercises routinely.  The patient denies any new symptoms such as chest discomfort, neck or arm discomfort. There has been no new shortness of breath, PND or orthopnea. There have been no reported palpitations, presyncope or syncope.   Allergies  Allergen Reactions  . Lisinopril Swelling    Swollen tongue   . Penicillins Hives and Rash    Current Outpatient Prescriptions  Medication Sig Dispense Refill  . acyclovir (ZOVIRAX) 200 MG capsule TAKE 2 CAPSULES (400 MG TOTAL) BY MOUTH 3 (THREE) TIMES DAILY AS NEEDED FOR COLD SORES 90 capsule 2  . amLODipine (NORVASC) 10 MG tablet Take 1 tablet (10 mg total) by mouth daily. 90 tablet 3  . aspirin 81 MG tablet Take 81 mg by mouth daily.    Marland Kitchen. atorvastatin (LIPITOR) 40 MG tablet Take 1 tablet (40 mg total) by mouth daily at 6 PM. 90 tablet 3  . carvedilol (COREG) 12.5 MG tablet Take 1 tablet (12.5 mg total) by mouth 2 (two) times daily with a meal. 180 tablet 3  . colchicine 0.6 MG tablet Take 1 tablet (0.6 mg total) by mouth daily. Please schedule visit with Adline MangoPadonda Campbell before further refills. 30 tablet 0  . glimepiride (AMARYL) 2 MG tablet Take 1 tablet (2 mg total) by mouth daily with breakfast. 90 tablet 3  . metFORMIN (GLUCOPHAGE) 500 MG tablet Take 1 tablet (500 mg total) by mouth daily with breakfast. Will need to get future refills from primary care. 90 tablet 3  . Multiple Vitamin (MULITIVITAMIN WITH MINERALS) TABS Take 1 tablet by mouth daily.    . Omega-3 Fatty Acids (FISH OIL) 1200 MG CAPS Take 1,200 mg by mouth 3 (three) times daily.     No current facility-administered medications for this visit.     Past Medical History:  Diagnosis Date  . Atrial fibrillation (HCC) 12/28/2011  . 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 Coronary artery disease   . Diabetes mellitus   . Diabetes mellitus  type 2, noninsulin dependent (HCC)    a. diagnosed 2012  . GERD (gastroesophageal reflux disease)   . HTN (hypertension)   . Myocardial infarction   . TOBACCO ABUSE 04/14/2008    Past Surgical History:  Procedure Laterality Date  . CARDIAC CATHETERIZATION     12-01-2011  . CORONARY ARTERY BYPASS GRAFT  12/13/2011   Procedure: CORONARY ARTERY BYPASS GRAFTING (CABG);  Surgeon: Alleen BorneBryan K Bartle, MD;  [facility];  Service: Open Heart Surgery;  Laterality: N/A;  Times five, on pump, using endoscopically harvested right greater saphenous vein and left internal mammary artery.   Marland Kitchen. left arm surgery as a child    . LEFT HEART CATHETERIZATION WITH CORONARY ANGIOGRAM N/A 12/01/2011   Procedure: LEFT HEART CATHETERIZATION WITH CORONARY ANGIOGRAM;  Surgeon: Herby Abrahamhomas D Stuckey, MD;  [facility];  Service: Cardiovascular;  Laterality: N/A;  . PERCUTANEOUS CORONARY INTERVENTION-BALLOON ONLY N/A 12/01/2011   Procedure: PERCUTANEOUS CORONARY INTERVENTION-BALLOON ONLY;  Surgeon: Herby Abrahamhomas D Stuckey, MD;  [facility];  Service: Cardiovascular;  Laterality: N/A;    ROS:   As stated in the HPI and negative for all other systems.  PHYSICAL EXAM BP 114/80 (BP Location: Left Arm, Patient Position: Sitting, Cuff Size: Large)   Pulse 62   Ht 5\' 11"  (1.803 m)   Wt  234 lb 8 oz (106.4 kg)   BMI 32.71 kg/m  GENERAL:  Well appearing NECK:  No jugular venous distention, waveform within normal limits, carotid upstroke brisk and symmetric, no bruits, no thyromegaly LUNGS:  Clear to auscultation bilaterally CHEST:  Well healed sternotomy scar. HEART:  PMI not displaced or sustained,S1 and S2 within normal limits, no S3, no S4, no clicks, no rubs, no murmurs ABD:  Flat, positive bowel sounds normal in frequency in pitch, no bruits, no rebound, no guarding, no midline pulsatile mass, no hepatomegaly, no splenomegaly EXT:  2 plus pulses throughout, trace edema, no cyanosis no clubbing  EKG:  Sinus  rhythm, rate 63, axis within normal limits, intervals within normal limits, old inferior infarct, no acute ST-T wave changes.  07/05/2016   ASSESSMENT AND PLAN  CAD:  The patient has no new sypmtoms.  No further cardiovascular testing is indicated.  We will continue with aggressive risk reduction and meds as listed.  OBESITY:  I am proud of his weight loss.  He will continue the same.   DYSLIPIDEMIA:  I will check a lipid profile and other routine labs.  DIABETES:   I will check an A1C.  He is between primary care doctors right now  HTN:  The blood pressure is at target. No change in medications is indicated. We will continue with therapeutic lifestyle changes (TLC).

## 2016-07-05 ENCOUNTER — Ambulatory Visit (INDEPENDENT_AMBULATORY_CARE_PROVIDER_SITE_OTHER): Payer: Medicare HMO | Admitting: Cardiology

## 2016-07-05 ENCOUNTER — Encounter: Payer: Self-pay | Admitting: Cardiology

## 2016-07-05 VITALS — BP 114/80 | HR 62 | Ht 71.0 in | Wt 234.5 lb

## 2016-07-05 DIAGNOSIS — I25810 Atherosclerosis of coronary artery bypass graft(s) without angina pectoris: Secondary | ICD-10-CM

## 2016-07-05 DIAGNOSIS — E119 Type 2 diabetes mellitus without complications: Secondary | ICD-10-CM

## 2016-07-05 DIAGNOSIS — E785 Hyperlipidemia, unspecified: Secondary | ICD-10-CM

## 2016-07-05 DIAGNOSIS — Z79899 Other long term (current) drug therapy: Secondary | ICD-10-CM | POA: Diagnosis not present

## 2016-07-05 LAB — CBC
HCT: 42.6 % (ref 38.5–50.0)
Hemoglobin: 14.6 g/dL (ref 13.2–17.1)
MCH: 31.5 pg (ref 27.0–33.0)
MCHC: 34.3 g/dL (ref 32.0–36.0)
MCV: 92 fL (ref 80.0–100.0)
MPV: 11.9 fL (ref 7.5–12.5)
PLATELETS: 187 10*3/uL (ref 140–400)
RBC: 4.63 MIL/uL (ref 4.20–5.80)
RDW: 12.9 % (ref 11.0–15.0)
WBC: 7 10*3/uL (ref 3.8–10.8)

## 2016-07-05 LAB — COMPREHENSIVE METABOLIC PANEL
ALBUMIN: 4.3 g/dL (ref 3.6–5.1)
ALT: 28 U/L (ref 9–46)
AST: 17 U/L (ref 10–35)
Alkaline Phosphatase: 44 U/L (ref 40–115)
BILIRUBIN TOTAL: 1.1 mg/dL (ref 0.2–1.2)
BUN: 26 mg/dL — ABNORMAL HIGH (ref 7–25)
CO2: 28 mmol/L (ref 20–31)
CREATININE: 0.83 mg/dL (ref 0.70–1.18)
Calcium: 9.6 mg/dL (ref 8.6–10.3)
Chloride: 107 mmol/L (ref 98–110)
Glucose, Bld: 111 mg/dL — ABNORMAL HIGH (ref 65–99)
Potassium: 5.1 mmol/L (ref 3.5–5.3)
SODIUM: 143 mmol/L (ref 135–146)
TOTAL PROTEIN: 6.7 g/dL (ref 6.1–8.1)

## 2016-07-05 LAB — LIPID PANEL
Cholesterol: 97 mg/dL — ABNORMAL LOW (ref 125–200)
HDL: 49 mg/dL (ref >=40)
LDL Cholesterol: 39 mg/dL (ref <130)
Total CHOL/HDL Ratio: 2 Ratio (ref <=5.0)
Triglycerides: 43 mg/dL (ref <150)
VLDL: 9 mg/dL (ref <30)

## 2016-07-05 MED ORDER — METFORMIN HCL 500 MG PO TABS: 500.0000 mg | ORAL_TABLET | Freq: Every day | ORAL | 3 refills | Status: DC

## 2016-07-05 NOTE — Patient Instructions (Addendum)
Medication Instructions:  Continue current medications  Labwork: A1c, Fasting Lipids, CMP, CBC  Testing/Procedures: None Ordered  Follow-Up: Your physician wants you to follow-up in: 1 Year. You will receive a reminder letter in the mail two months in advance. If you don't receive a letter, please call our office to schedule the follow-up appointment.   Any Other Special Instructions Will Be Listed Below (If Applicable).   If you need a refill on your cardiac medications before your next appointment, please call your pharmacy.

## 2016-07-06 LAB — HEMOGLOBIN A1C
HEMOGLOBIN A1C: 5.1 % (ref <5.7)
MEAN PLASMA GLUCOSE: 100 mg/dL

## 2016-08-10 ENCOUNTER — Encounter: Payer: Self-pay | Admitting: Gastroenterology

## 2016-08-18 ENCOUNTER — Encounter: Payer: Self-pay | Admitting: Gastroenterology

## 2016-09-26 ENCOUNTER — Ambulatory Visit (AMBULATORY_SURGERY_CENTER): Payer: Self-pay

## 2016-09-26 VITALS — Ht 71.0 in | Wt 250.2 lb

## 2016-09-26 DIAGNOSIS — Z1211 Encounter for screening for malignant neoplasm of colon: Secondary | ICD-10-CM

## 2016-09-26 MED ORDER — NA SULFATE-K SULFATE-MG SULF 17.5-3.13-1.6 GM/177ML PO SOLN: ORAL | 0 refills | Status: DC

## 2016-09-26 NOTE — Progress Notes (Signed)
Per pt, no allergies to soy or egg products.Pt not taking any weight loss meds or using  O2 at home. 

## 2016-09-29 ENCOUNTER — Encounter: Payer: Self-pay | Admitting: Gastroenterology

## 2016-10-10 ENCOUNTER — Encounter: Payer: Self-pay | Admitting: Gastroenterology

## 2016-10-10 ENCOUNTER — Ambulatory Visit (AMBULATORY_SURGERY_CENTER): Payer: Medicare HMO | Admitting: Gastroenterology

## 2016-10-10 VITALS — BP 137/95 | HR 59 | Temp 97.1°F | Resp 16 | Ht 71.0 in | Wt 250.0 lb

## 2016-10-10 DIAGNOSIS — Z1212 Encounter for screening for malignant neoplasm of rectum: Secondary | ICD-10-CM

## 2016-10-10 DIAGNOSIS — Z1211 Encounter for screening for malignant neoplasm of colon: Secondary | ICD-10-CM | POA: Diagnosis not present

## 2016-10-10 MED ORDER — SODIUM CHLORIDE 0.9 % IV SOLN: 500.0000 mL | INTRAVENOUS | Status: DC

## 2016-10-10 NOTE — Progress Notes (Signed)
Pt's states no medical or surgical changes since previsit or office visit. 

## 2016-10-10 NOTE — Op Note (Signed)
[facility] Patient Name: Daniel [name] Procedure Date: 10/10/2016 9:20 AM MRN: [id number] Endoscopist: Sherilyn Cooter L. Myrtie Neither , MD Age: 72 Referring MD:  Date of Birth: 11/22/1945 Gender: Male Account #: [id number] Procedure:                Colonoscopy Indications:              Screening for colorectal malignant neoplasm (no                            polyps on 2007 colonoscopy) Medicines:                Monitored Anesthesia Care Procedure:                Pre-Anesthesia Assessment:                           - Prior to the procedure, a History and Physical                            was performed, and patient medications and                            allergies were reviewed. The patient's tolerance of                            previous anesthesia was also reviewed. The risks                            and benefits of the procedure and the sedation                            options and risks were discussed with the patient.                            All questions were answered, and informed consent                            was obtained. Anticoagulants: The patient has taken                            aspirin. It was decided not to withhold this                            medication prior to the procedure. ASA Grade                            Assessment: III - A patient with severe systemic                            disease. After reviewing the risks and benefits,                            the patient was deemed in satisfactory condition to  undergo the procedure.                           After obtaining informed consent, the colonoscope                            was passed under direct vision. Throughout the                            procedure, the patient's blood pressure, pulse, and                            oxygen saturations were monitored continuously. The                            Model CF-HQ190L [id number]) scope was introduced                           through the anus and advanced to the the cecum,                            identified by appendiceal orifice and ileocecal                            valve. The colonoscopy was performed with moderate                            difficulty due to multiple diverticula in the                            colon, significant looping and a tortuous colon.                            The patient tolerated the procedure well. The                            quality of the bowel preparation was poor despite                            extensive lavage. The ileocecal valve, appendiceal                            orifice, and rectum were photographed. The quality                            of the bowel preparation was evaluated using the                            BBPS [facility] Bowel Preparation Scale) with scores                            of: Right Colon = 1, Transverse Colon = 1 and Left  Colon = 1. The total BBPS score equals 3. The                            quality of the bowel preparation was inadequate.                            The bowel preparation used was SUPREP. Scope In: 9:29:46 AM Scope Out: 9:50:21 AM Scope Withdrawal Time: 0 hours 10 minutes 47 seconds  Total Procedure Duration: 0 hours 20 minutes 35 seconds  Findings:                 The perianal and digital rectal examinations were                            normal.                           Many large-mouthed diverticula were found in the                            entire colon.                           Internal hemorrhoids were found during retroflexion                            and during anoscopy. The hemorrhoids were                            medium-sized and Grade I (internal hemorrhoids that                            do not prolapse). Complications:            No immediate complications. Estimated Blood Loss:     Estimated blood loss: none. Impression:               -  Preparation of the colon was poor.                           - Diverticulosis in the entire examined colon.                           - Internal hemorrhoids.                           - No specimens collected. Recommendation:           - Patient has a contact number available for                            emergencies. The signs and symptoms of potential                            delayed complications were discussed with the  patient. Return to normal activities tomorrow.                            Written discharge instructions were provided to the                            patient.                           - Resume previous diet.                           - Continue present medications.                           - Repeat colonoscopy in 1 year for screening                            purposes due to poor preparation. More extensive                            bowel preparation will be used for the next exam. Sherilyn Cooter L. Myrtie Neither, MD 10/10/2016 9:56:05 AM This report has been signed electronically.

## 2016-10-10 NOTE — Progress Notes (Signed)
Spontaneous respirations throughout. VSS. Resting comfortably. To PACU on room air. Report to  Sara RN. 

## 2016-10-10 NOTE — Patient Instructions (Signed)
YOU HAD AN ENDOSCOPIC PROCEDURE TODAY AT THE [facility]:   Refer to the procedure report that was given to you for any specific questions about what was found during the examination.  If the procedure report does not answer your questions, please call your gastroenterologist to clarify.  If you requested that your care partner not be given the details of your procedure findings, then the procedure report has been included in a sealed envelope for you to review at your convenience later.  YOU SHOULD EXPECT: Some feelings of bloating in the abdomen. Passage of more gas than usual.  Walking can help get rid of the air that was put into your GI tract during the procedure and reduce the bloating. If you had a lower endoscopy (such as a colonoscopy or flexible sigmoidoscopy) you may notice spotting of blood in your stool or on the toilet paper. If you underwent a bowel prep for your procedure, you may not have a normal bowel movement for a few days.  Please Note:  You might notice some irritation and congestion in your nose or some drainage.  This is from the oxygen used during your procedure.  There is no need for concern and it should clear up in a day or so.  SYMPTOMS TO REPORT IMMEDIATELY:   Following lower endoscopy (colonoscopy or flexible sigmoidoscopy):  Excessive amounts of blood in the stool  Significant tenderness or worsening of abdominal pains  Swelling of the abdomen that is new, acute  Fever of 100F or higher  For urgent or emergent issues, a gastroenterologist can be reached at any hour by calling [phone].  DIET:  We do recommend a small meal at first, but then you may proceed to your regular diet.  Drink plenty of fluids but you should avoid alcoholic beverages for 24 hours.  ACTIVITY:  You should plan to take it easy for the rest of today and you should NOT DRIVE or use heavy machinery until tomorrow (because of the sedation medicines used during the test).     FOLLOW UP: Our staff will call the number listed on your records the next business day following your procedure to check on you and address any questions or concerns that you may have regarding the information given to you following your procedure. If we do not reach you, we will leave a message.  However, if you are feeling well and you are not experiencing any problems, there is no need to return our call.  We will assume that you have returned to your regular daily activities without incident.  If any biopsies were taken you will be contacted by phone or by letter within the next 1-3 weeks.  Please call us at [phone] if you have not heard about the biopsies in 3 weeks.   Please read all hand-outs given to you by your recovery nurse. Thank you for allowing us to provide for your healthcare needs today.  SIGNATURES/CONFIDENTIALITY: You and/or your care partner have signed paperwork which will be entered into your electronic medical record.  These signatures attest to the fact that that the information above on your After Visit Summary has been reviewed and is understood.  Full responsibility of the confidentiality of this discharge information lies with you and/or your care-partner.

## 2016-10-11 ENCOUNTER — Telehealth: Payer: Self-pay

## 2016-10-11 NOTE — Telephone Encounter (Signed)
  Follow up Call-  Call back number 10/10/2016  Post procedure Call Back phone  # [phone]  Permission to leave phone message Yes  Some recent data might be hidden     Patient questions:  Do you have a fever, pain , or abdominal swelling? No. Pain Score  0 *  Have you tolerated food without any problems? Yes.    Have you been able to return to your normal activities? Yes.    Do you have any questions about your discharge instructions: Diet   No. Medications  No. Follow up visit  No.  Do you have questions or concerns about your Care? No.  Actions: * If pain score is 4 or above: No action needed, pain <4.

## 2016-11-03 DIAGNOSIS — M1A9XX0 Chronic gout, unspecified, without tophus (tophi): Secondary | ICD-10-CM | POA: Insufficient documentation

## 2016-11-03 DIAGNOSIS — B002 Herpesviral gingivostomatitis and pharyngotonsillitis: Secondary | ICD-10-CM | POA: Insufficient documentation

## 2017-01-03 ENCOUNTER — Other Ambulatory Visit: Payer: Self-pay | Admitting: Cardiology

## 2017-03-22 ENCOUNTER — Other Ambulatory Visit: Payer: Self-pay

## 2017-03-22 MED ORDER — ATORVASTATIN CALCIUM 40 MG PO TABS: 40.0000 mg | ORAL_TABLET | Freq: Every day | ORAL | 1 refills | Status: DC

## 2017-04-09 ENCOUNTER — Other Ambulatory Visit: Payer: Self-pay | Admitting: Cardiology

## 2017-04-09 NOTE — Telephone Encounter (Signed)
REFILL 

## 2017-07-19 ENCOUNTER — Telehealth: Payer: Self-pay | Admitting: Cardiology

## 2017-07-19 ENCOUNTER — Other Ambulatory Visit: Payer: Self-pay | Admitting: Cardiology

## 2017-07-19 MED ORDER — GLIMEPIRIDE 2 MG PO TABS: ORAL_TABLET | ORAL | 0 refills | Status: DC

## 2017-07-19 MED ORDER — CARVEDILOL 12.5 MG PO TABS: 12.5000 mg | ORAL_TABLET | Freq: Two times a day (BID) | ORAL | 0 refills | Status: DC

## 2017-07-19 MED ORDER — METFORMIN HCL 500 MG PO TABS: 500.0000 mg | ORAL_TABLET | Freq: Every day | ORAL | 3 refills | Status: DC

## 2017-07-19 MED ORDER — ATORVASTATIN CALCIUM 40 MG PO TABS: 40.0000 mg | ORAL_TABLET | Freq: Every day | ORAL | 1 refills | Status: DC

## 2017-07-19 NOTE — Telephone Encounter (Signed)
°*  STAT* If patient is at the pharmacy, call can be transferred to refill team.   1. Which medications need to be refilled? (please list name of each medication and dose if known) Amlodipine 10 mg   2. Which pharmacy/location (including street and city if local pharmacy) is medication to be sent to?[facility]   3. Do they need a 30 day or 90 day supply?30

## 2017-07-19 NOTE — Telephone Encounter (Signed)
Refill sent to the pharmacy electronically.  

## 2017-07-19 NOTE — Telephone Encounter (Signed)
New message     *STAT* If patient is at the pharmacy, call can be transferred to refill team.   1. Which medications need to be refilled? (please list name of each medication and dose if known) amLODipine (NORVASC) 10 MG tablet and carvedilol (COREG) 12.5 MG tablet and metFORMIN (GLUCOPHAGE) 500 MG tablet , atorvastatin (LIPITOR) 40 MG tablet and  glimepiride (AMARYL) 2 MG tablet 2. Which pharmacy/location (including street and city if local pharmacy) is medication to be sent to? [facility]  3. Do they need a 30 day or 90 day supply? 30

## 2017-07-20 MED ORDER — AMLODIPINE BESYLATE 10 MG PO TABS: 10.0000 mg | ORAL_TABLET | Freq: Every day | ORAL | 0 refills | Status: DC

## 2017-07-20 NOTE — Telephone Encounter (Signed)
Rx(s) sent to pharmacy electronically.  

## 2017-07-23 DIAGNOSIS — K219 Gastro-esophageal reflux disease without esophagitis: Secondary | ICD-10-CM | POA: Insufficient documentation

## 2017-07-23 DIAGNOSIS — I2510 Atherosclerotic heart disease of native coronary artery without angina pectoris: Secondary | ICD-10-CM | POA: Insufficient documentation

## 2017-08-02 NOTE — Progress Notes (Signed)
HPI The patient presents for follow up of CAD/CABG.  Since he was last seen he has done well.  The patient denies any new symptoms such as chest discomfort, neck or arm discomfort. There has been no new shortness of breath, PND or orthopnea. There have been no reported palpitations, presyncope or syncope.  He has been eating red meat and drinking more wine and has gained some of the weight that he lost.  The patient denies any new symptoms such as chest discomfort, neck or arm discomfort. There has been no new shortness of breath, PND or orthopnea. There have been no reported palpitations, presyncope or syncope.  He is doing some walking.    Allergies  Allergen Reactions  . Lisinopril Swelling    Swollen tongue   . Penicillins Hives and Rash    Current Outpatient Medications  Medication Sig Dispense Refill  . acyclovir (ZOVIRAX) 200 MG capsule TAKE 2 CAPSULES (400 MG TOTAL) BY MOUTH 3 (THREE) TIMES DAILY AS NEEDED FOR COLD SORES 90 capsule 2  . amLODipine (NORVASC) 10 MG tablet Take 1 tablet (10 mg total) daily by mouth. 30 tablet 0  . aspirin 81 MG tablet Take 81 mg by mouth daily.    Marland Kitchen. atorvastatin (LIPITOR) 40 MG tablet Take 1 tablet (40 mg total) daily at 6 PM by mouth. PLEASE CONTACT OFFICE FOR ADDITIONAL REFILLS 90 tablet 1  . carvedilol (COREG) 12.5 MG tablet Take 1 tablet (12.5 mg total) 2 (two) times daily with a meal by mouth. 180 tablet 0  . colchicine 0.6 MG tablet Take 1 tablet (0.6 mg total) by mouth daily. Please schedule visit with Adline MangoPadonda Campbell before further refills. 30 tablet 0  . glimepiride (AMARYL) 2 MG tablet TAKE 1 TABLET EVERY DAY WITH BREAKFAST 30 tablet 0  . metFORMIN (GLUCOPHAGE) 500 MG tablet Take 1 tablet (500 mg total) daily with breakfast by mouth. Will need to get future refills from primary care. 30 tablet 3  . Multiple Vitamin (MULITIVITAMIN WITH MINERALS) TABS Take 1 tablet by mouth daily.    . Omega-3 Fatty Acids (FISH OIL) 1200 MG CAPS Take 1,200  mg by mouth 3 (three) times daily.     Current Facility-Administered Medications  Medication Dose Route Frequency Provider Last Rate Last Dose  . 0.9 %  sodium chloride infusion  500 mL Intravenous Continuous Sherrilyn Ristanis, Henry L III, MD        Past Medical History:  Diagnosis Date  . Atrial fibrillation (HCC) 12/28/2011  . 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 Coronary artery disease   . Diabetes mellitus   . Diabetes mellitus type 2, noninsulin dependent (HCC)    a. diagnosed 2012  . GERD (gastroesophageal reflux disease)   . HTN (hypertension)   . Myocardial infarction (HCC) 2013  . TOBACCO ABUSE 04/14/2008    Past Surgical History:  Procedure Laterality Date  . CARDIAC CATHETERIZATION     12-01-2011  . CORONARY ARTERY BYPASS GRAFT  12/13/2011   Procedure: CORONARY ARTERY BYPASS GRAFTING (CABG);  Surgeon: Alleen BorneBryan K Bartle, MD;  [facility];  Service: Open Heart Surgery;  Laterality: N/A;  Times five, on pump, using endoscopically harvested right greater saphenous vein and left internal mammary artery.   . CORONARY ARTERY BYPASS GRAFT  2013  . left arm surgery as a child  [id number]  . LEFT HEART CATHETERIZATION WITH CORONARY ANGIOGRAM N/A 12/01/2011   Procedure: LEFT HEART CATHETERIZATION WITH CORONARY ANGIOGRAM;  Surgeon: Herby Abrahamhomas D Stuckey, MD;  Location:  [facility] C[facility] LAB;  Service: Cardiovascular;  Laterality: N/A;  . PERCUTANEOUS CORONARY INTERVENTION-BALLOON ONLY N/A 12/01/2011   Procedure: PERCUTANEOUS CORONARY INTERVENTION-BALLOON ONLY;  Surgeon: Herby Abrahamhomas D Stuckey, MD;  [facility];  Service: Cardiovascular;  Laterality: N/A;    ROS:   As stated in the HPI and negative for all other systems.  PHYSICAL EXAM BP (!) 142/92   Pulse 65   Ht 5\' 11"  (1.803 m)   Wt 267 lb (121.1 kg)   BMI 37.24 kg/m   GENERAL:  Well appearing NECK:  No jugular venous distention, waveform within normal limits, carotid upstroke brisk and symmetric, no bruits, no thyromegaly LUNGS:  Clear to  auscultation bilaterally CHEST:  Well healed sternotomy scar. HEART:  PMI not displaced or sustained,S1 and S2 within normal limits, no S3, no S4, no clicks, no rubs, no murmurs ABD:  Flat, positive bowel sounds normal in frequency in pitch, no bruits, no rebound, no guarding, no midline pulsatile mass, no hepatomegaly, no splenomegaly EXT:  2 plus pulses throughout, no edema, no cyanosis no clubbing  EKG:  Sinus rhythm, rate 65, axis within normal limits, intervals within normal limits, old inferior infarct, no acute ST-T wave changes.  08/03/2017  Lab Results  Component Value Date   CHOL 97 (L) 07/05/2016   TRIG 43 07/05/2016   HDL 49 07/05/2016   LDLCALC 39 07/05/2016   ASSESSMENT AND PLAN  CAD:  The patient has no new sypmtoms.  No further cardiovascular testing is indicated.  We will continue with aggressive risk reduction and meds as listed.  OBESITY:  We talked about weight loss.  He has gained some back.  DYSLIPIDEMIA:  This was at target last year.   I will repeat a lipid profile and liver profile today   DIABETES:    A1C last year was 5.1 which was much improved.  I did talk to him about Jardiance and asked him to discuss this with his PCP given the proven cardiac benefits.    HTN:  The blood pressure is at target most of the time although mildly elevated today. No change in medications is indicated. We will continue with therapeutic lifestyle changes (TLC).

## 2017-08-03 ENCOUNTER — Encounter: Payer: Self-pay | Admitting: Cardiology

## 2017-08-03 ENCOUNTER — Ambulatory Visit: Payer: Medicare HMO | Admitting: Cardiology

## 2017-08-03 VITALS — BP 142/92 | HR 65 | Ht 71.0 in | Wt 267.0 lb

## 2017-08-03 DIAGNOSIS — E669 Obesity, unspecified: Secondary | ICD-10-CM | POA: Insufficient documentation

## 2017-08-03 DIAGNOSIS — I25810 Atherosclerosis of coronary artery bypass graft(s) without angina pectoris: Secondary | ICD-10-CM | POA: Diagnosis not present

## 2017-08-03 DIAGNOSIS — E118 Type 2 diabetes mellitus with unspecified complications: Secondary | ICD-10-CM | POA: Diagnosis not present

## 2017-08-03 DIAGNOSIS — Z79899 Other long term (current) drug therapy: Secondary | ICD-10-CM | POA: Insufficient documentation

## 2017-08-03 DIAGNOSIS — E785 Hyperlipidemia, unspecified: Secondary | ICD-10-CM

## 2017-08-03 LAB — LIPID PANEL
CHOLESTEROL TOTAL: 148 mg/dL (ref 100–199)
Chol/HDL Ratio: 3.1 ratio (ref 0.0–5.0)
HDL: 47 mg/dL (ref >39)
LDL Calculated: 73 mg/dL (ref 0–99)
TRIGLYCERIDES: 138 mg/dL (ref 0–149)
VLDL CHOLESTEROL CAL: 28 mg/dL (ref 5–40)

## 2017-08-03 LAB — HEPATIC FUNCTION PANEL
ALT: 19 IU/L (ref 0–44)
AST: 11 IU/L (ref 0–40)
Albumin: 4.4 g/dL (ref 3.5–4.8)
Alkaline Phosphatase: 73 IU/L (ref 39–117)
Bilirubin Total: 0.9 mg/dL (ref 0.0–1.2)
Bilirubin, Direct: 0.23 mg/dL (ref 0.00–0.40)
Total Protein: 6.7 g/dL (ref 6.0–8.5)

## 2017-08-03 MED ORDER — ATORVASTATIN CALCIUM 40 MG PO TABS: 40.0000 mg | ORAL_TABLET | Freq: Every day | ORAL | 3 refills | Status: DC

## 2017-08-03 MED ORDER — AMLODIPINE BESYLATE 10 MG PO TABS: 10.0000 mg | ORAL_TABLET | Freq: Every day | ORAL | 3 refills | Status: DC

## 2017-08-03 MED ORDER — CARVEDILOL 12.5 MG PO TABS: 12.5000 mg | ORAL_TABLET | Freq: Two times a day (BID) | ORAL | 3 refills | Status: DC

## 2017-08-03 NOTE — Patient Instructions (Signed)
Medication Instructions:  Continue current medications  If you need a refill on your cardiac medications before your next appointment, please call your pharmacy.  Labwork: Fasting Lipid Liver HERE IN OUR OFFICE AT [facility]  Take the provided lab slips for you to take with you to the lab for you blood draw.   You will need to fast. DO NOT EAT OR DRINK PAST MIDNIGHT.    You may go to any [facility] lab that is convenient for you however, we do have a lab in our office that is able to assist you. You do NOT need an appointment for our lab. Once in our office lobby there is a podium to the right of the check-in desk where you are to sign-in and ring a doorbell to alert us you are here. Lab is open Monday-Friday from 8:00am to 4:00pm; and is closed for lunch from 12:45p-1:45pm   Testing/Procedures: None Ordered  Follow-Up: Your physician wants you to follow-up in: 1 Year. You should receive a reminder letter in the mail two months in advance. If you do not receive a letter, please call our office [phone].    Thank you for choosing [facility]!!

## 2017-08-08 NOTE — Addendum Note (Signed)
Addended by: Chana BodeGREEN, Taite Baldassari L on: 08/08/2017 02:14 PM   Modules accepted: Orders

## 2017-08-24 ENCOUNTER — Other Ambulatory Visit: Payer: Self-pay | Admitting: Cardiology

## 2017-09-01 ENCOUNTER — Other Ambulatory Visit: Payer: Self-pay | Admitting: Cardiology

## 2017-10-03 ENCOUNTER — Encounter: Payer: Self-pay | Admitting: Gastroenterology

## 2017-11-28 ENCOUNTER — Other Ambulatory Visit: Payer: Self-pay | Admitting: Cardiology

## 2017-12-05 ENCOUNTER — Other Ambulatory Visit: Payer: Self-pay | Admitting: Cardiology

## 2018-02-13 ENCOUNTER — Other Ambulatory Visit: Payer: Self-pay

## 2018-02-14 ENCOUNTER — Other Ambulatory Visit: Payer: Self-pay | Admitting: *Deleted

## 2018-02-14 MED ORDER — AMLODIPINE BESYLATE 10 MG PO TABS: 10.0000 mg | ORAL_TABLET | Freq: Every day | ORAL | 0 refills | Status: DC

## 2018-02-14 MED ORDER — CARVEDILOL 12.5 MG PO TABS: 12.5000 mg | ORAL_TABLET | Freq: Two times a day (BID) | ORAL | 0 refills | Status: DC

## 2018-02-14 MED ORDER — ATORVASTATIN CALCIUM 40 MG PO TABS: 40.0000 mg | ORAL_TABLET | Freq: Every day | ORAL | 0 refills | Status: DC

## 2018-07-10 ENCOUNTER — Encounter: Payer: Self-pay | Admitting: Family Medicine

## 2018-07-10 DIAGNOSIS — G4733 Obstructive sleep apnea (adult) (pediatric): Secondary | ICD-10-CM | POA: Insufficient documentation

## 2018-07-23 ENCOUNTER — Encounter: Payer: Self-pay | Admitting: Family Medicine

## 2018-08-08 NOTE — Progress Notes (Signed)
HPI The patient presents for follow up of CAD/CABG.  Since he was last seen he has done well.  He walks about 20 minutes daily.  He denies any cardiovascular symptoms.  He has had no chest discomfort, neck or arm discomfort.  He said no palpitations, presyncope or syncope.  He has had some intentional weight loss.   Allergies  Allergen Reactions  . Lisinopril Swelling    Swollen tongue   . Penicillins Hives and Rash    Current Outpatient Medications  Medication Sig Dispense Refill  . amLODipine (NORVASC) 10 MG tablet Take 1 tablet (10 mg total) by mouth daily. 90 tablet 0  . aspirin 81 MG tablet Take 81 mg by mouth daily.    Marland Kitchen. atorvastatin (LIPITOR) 40 MG tablet Take 1 tablet (40 mg total) by mouth daily at 6 PM. PLEASE CONTACT OFFICE FOR ADDITIONAL REFILLS 90 tablet 0  . carvedilol (COREG) 12.5 MG tablet Take 1 tablet (12.5 mg total) by mouth 2 (two) times daily with a meal. 180 tablet 0  . glipiZIDE (GLUCOTROL) 5 MG tablet Take 7.5 mg by mouth 2 (two) times daily.    . Multiple Vitamin (MULITIVITAMIN WITH MINERALS) TABS Take 1 tablet by mouth daily.    . Omega-3 Fatty Acids (FISH OIL) 1200 MG CAPS Take 1,200 mg by mouth 3 (three) times daily.    Marland Kitchen. acyclovir (ZOVIRAX) 200 MG capsule TAKE 2 CAPSULES (400 MG TOTAL) BY MOUTH 3 (THREE) TIMES DAILY AS NEEDED FOR COLD SORES 90 capsule 2  . colchicine 0.6 MG tablet Take 1 tablet (0.6 mg total) by mouth daily. Please schedule visit with Adline MangoPadonda Campbell before further refills. 30 tablet 0   Current Facility-Administered Medications  Medication Dose Route Frequency Provider Last Rate Last Dose  . 0.9 %  sodium chloride infusion  500 mL Intravenous Continuous Sherrilyn Ristanis, Henry L III, MD        Past Medical History:  Diagnosis Date  . Atrial fibrillation (HCC) 12/28/2011  . 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 Coronary artery disease   . Diabetes mellitus   . Diabetes mellitus type 2, noninsulin dependent (HCC)    a. diagnosed 2012  .  GERD (gastroesophageal reflux disease)   . HTN (hypertension)   . Myocardial infarction (HCC) 2013  . TOBACCO ABUSE 04/14/2008    Past Surgical History:  Procedure Laterality Date  . CARDIAC CATHETERIZATION     12-01-2011  . CORONARY ARTERY BYPASS GRAFT  12/13/2011   Procedure: CORONARY ARTERY BYPASS GRAFTING (CABG);  Surgeon: Alleen BorneBryan K Bartle, MD;  [facility];  Service: Open Heart Surgery;  Laterality: N/A;  Times five, on pump, using endoscopically harvested right greater saphenous vein and left internal mammary artery.   . CORONARY ARTERY BYPASS GRAFT  2013  . left arm surgery as a child  [id number]  . LEFT HEART CATHETERIZATION WITH CORONARY ANGIOGRAM N/A 12/01/2011   Procedure: LEFT HEART CATHETERIZATION WITH CORONARY ANGIOGRAM;  Surgeon: Herby Abrahamhomas D Stuckey, MD;  [facility];  Service: Cardiovascular;  Laterality: N/A;  . PERCUTANEOUS CORONARY INTERVENTION-BALLOON ONLY N/A 12/01/2011   Procedure: PERCUTANEOUS CORONARY INTERVENTION-BALLOON ONLY;  Surgeon: Herby Abrahamhomas D Stuckey, MD;  [facility];  Service: Cardiovascular;  Laterality: N/A;    ROS:   As stated in the HPI and negative for all other systems.  PHYSICAL EXAM BP 118/76   Pulse 60   Ht 5\' 11"  (1.803 m)   Wt 245 lb 6.4 oz (111.3 kg)   BMI 34.23  kg/m   GENERAL:  Well appearing NECK:  No jugular venous distention, waveform within normal limits, carotid upstroke brisk and symmetric, no bruits, no thyromegaly LUNGS:  Clear to auscultation bilaterally CHEST:  Well healed sternotomy scar. HEART:  PMI not displaced or sustained,S1 and S2 within normal limits, no S3, no S4, no clicks, no rubs, no murmurs ABD:  Flat, positive bowel sounds normal in frequency in pitch, no bruits, no rebound, no guarding, no midline pulsatile mass, no hepatomegaly, no splenomegaly EXT:  2 plus pulses throughout, no edema, no cyanosis no clubbing   EKG:  Sinus rhythm, rate 60, axis within normal limits, intervals within normal limits, old  inferior infarct, no acute ST-T wave changes.  08/12/2018  Lab Results  Component Value Date   CHOL 148 08/03/2017   TRIG 138 08/03/2017   HDL 47 08/03/2017   LDLCALC 73 08/03/2017    ASSESSMENT AND PLAN  CAD:  The patient has no new sypmtoms.  No further cardiovascular testing is indicated.  We will continue with aggressive risk reduction and meds as listed.  OBESITY:   I am proud of his weight loss and encouraged more gradual weight reduction.  He is down from his peak which was 291.  DYSLIPIDEMIA: I do not have his most recent labs but he said his total was 123.  I will defer management to his primary provider but would like to get a copy.   DIABETES:    He said his A1c was 6.3 which is slightly increased from the 5.1 last year.  He has had his med adjustments.  I would suggest to his primary provider given his history of coronary disease that they consider GLP1 RA or a SGLT2 inhibitor.    HTN:  The blood pressure is at target.  No change in therapy.

## 2018-08-10 DIAGNOSIS — I779 Disorder of arteries and arterioles, unspecified: Secondary | ICD-10-CM | POA: Insufficient documentation

## 2018-08-10 DIAGNOSIS — Z8679 Personal history of other diseases of the circulatory system: Secondary | ICD-10-CM | POA: Insufficient documentation

## 2018-08-12 ENCOUNTER — Encounter: Payer: Self-pay | Admitting: Cardiology

## 2018-08-12 ENCOUNTER — Ambulatory Visit: Payer: Medicare HMO | Admitting: Cardiology

## 2018-08-12 ENCOUNTER — Encounter

## 2018-08-12 VITALS — BP 118/76 | HR 60 | Ht 71.0 in | Wt 245.4 lb

## 2018-08-12 DIAGNOSIS — I2510 Atherosclerotic heart disease of native coronary artery without angina pectoris: Secondary | ICD-10-CM

## 2018-08-12 DIAGNOSIS — I10 Essential (primary) hypertension: Secondary | ICD-10-CM

## 2018-08-12 NOTE — Patient Instructions (Signed)
Medication Instructions:  Continue current medications  If you need a refill on your cardiac medications before your next appointment, please call your pharmacy.  Labwork: None Ordered   If you have labs (blood work) drawn today and your tests are completely normal, you will receive your results only by: . MyChart Message (if you have MyChart) OR . A paper copy in the mail If you have any lab test that is abnormal or we need to change your treatment, we will call you to review the results.  Testing/Procedures: None Ordered  Follow-Up: You will need a follow up appointment in 1 Year.  Please call our office 2 months in advance([phone]) to schedule the (1 Year) appointment.  You may see  DR Hochrein, or one of the following Advanced Practice Providers on your designated Care Team:    . Rhonda Barrett, PA-C  -OR- Kathryn Lawrence, DNP, ANP  . Luke Kilroy, PA-C  . Hao Meng, PA-C -OR- Angela Duke, PA-C  At [facility], you and your health needs are our priority.  As part of our continuing mission to provide you with exceptional heart care, we have created designated Provider Care Teams.  These Care Teams include your primary Cardiologist (physician) and Advanced Practice Providers (APPs -  Physician Assistants and Nurse Practitioners) who all work together to provide you with the care you need, when you need it.  Thank you for choosing [facility] at [facility]!!         

## 2018-08-14 ENCOUNTER — Other Ambulatory Visit: Payer: Self-pay | Admitting: Family Medicine

## 2018-08-14 DIAGNOSIS — E785 Hyperlipidemia, unspecified: Secondary | ICD-10-CM

## 2018-08-14 DIAGNOSIS — I10 Essential (primary) hypertension: Secondary | ICD-10-CM

## 2018-09-09 ENCOUNTER — Ambulatory Visit
Admission: RE | Admit: 2018-09-09 | Discharge: 2018-09-09 | Disposition: A | Discharge status: Home / Self Care | Payer: Medicare HMO | Source: Ambulatory Visit | Attending: Family Medicine | Admitting: Family Medicine

## 2018-09-09 DIAGNOSIS — I10 Essential (primary) hypertension: Secondary | ICD-10-CM

## 2018-09-09 DIAGNOSIS — E785 Hyperlipidemia, unspecified: Secondary | ICD-10-CM

## 2018-09-12 ENCOUNTER — Other Ambulatory Visit: Payer: Self-pay | Admitting: Cardiology

## 2018-09-12 NOTE — Telephone Encounter (Signed)
Rx has been sent to the pharmacy electronically. ° °

## 2018-10-20 ENCOUNTER — Other Ambulatory Visit: Payer: Self-pay | Admitting: Cardiology

## 2018-10-31 ENCOUNTER — Other Ambulatory Visit: Payer: Self-pay | Admitting: Cardiology

## 2018-10-31 NOTE — Telephone Encounter (Signed)
Rx(s) sent to pharmacy electronically.  

## 2019-02-21 DIAGNOSIS — H2513 Age-related nuclear cataract, bilateral: Secondary | ICD-10-CM | POA: Insufficient documentation

## 2019-02-21 DIAGNOSIS — H35372 Puckering of macula, left eye: Secondary | ICD-10-CM | POA: Insufficient documentation

## 2019-02-21 DIAGNOSIS — Z7984 Long term (current) use of oral hypoglycemic drugs: Secondary | ICD-10-CM | POA: Insufficient documentation

## 2019-03-14 ENCOUNTER — Other Ambulatory Visit: Payer: Self-pay | Admitting: Cardiology

## 2019-05-26 DIAGNOSIS — E291 Testicular hypofunction: Secondary | ICD-10-CM | POA: Insufficient documentation

## 2019-06-04 ENCOUNTER — Other Ambulatory Visit: Payer: Self-pay | Admitting: Cardiology

## 2019-07-24 ENCOUNTER — Other Ambulatory Visit: Payer: Self-pay | Admitting: Cardiology

## 2019-07-29 ENCOUNTER — Other Ambulatory Visit: Payer: Self-pay | Admitting: Cardiology

## 2019-07-29 NOTE — Telephone Encounter (Signed)
Rx request sent to pharmacy.  

## 2019-08-13 DIAGNOSIS — Z7189 Other specified counseling: Secondary | ICD-10-CM | POA: Insufficient documentation

## 2019-08-13 DIAGNOSIS — E785 Hyperlipidemia, unspecified: Secondary | ICD-10-CM | POA: Insufficient documentation

## 2019-08-13 NOTE — Progress Notes (Signed)
Cardiology Office Note   Date:  08/14/2019   ID:  CARLY APPLEGATE, DOB 1946-02-07, MRN [id number]  PCP:  Buzzy Han, MD  Cardiologist:   Minus Breeding, MD  Chief Complaint  Patient presents with  . Coronary Artery Disease      History of Present Illness: Daniel [name] is a 73 y.o. male who presents for follow up of CAD/CABG.   Since I last saw him he has done well.  The patient denies any new symptoms such as chest discomfort, neck or arm discomfort. There has been no new shortness of breath, PND or orthopnea. There have been no reported palpitations, presyncope or syncope.    Past Medical History:  Diagnosis Date  . Atrial fibrillation ([place]) 12/28/2011  . 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 Coronary artery disease   . Diabetes mellitus   . Diabetes mellitus type 2, noninsulin dependent ([place])    a. diagnosed 2012  . GERD (gastroesophageal reflux disease)   . HTN (hypertension)   . Myocardial infarction ([place]) 2013  . TOBACCO ABUSE 04/14/2008    Past Surgical History:  Procedure Laterality Date  . CARDIAC CATHETERIZATION     12-01-2011  . CORONARY ARTERY BYPASS GRAFT  12/13/2011   Procedure: CORONARY ARTERY BYPASS GRAFTING (CABG);  Surgeon: Gaye Pollack, MD;  Location: [facility];  Service: Open Heart Surgery;  Laterality: N/A;  Times five, on pump, using endoscopically harvested right greater saphenous vein and left internal mammary artery.   . CORONARY ARTERY BYPASS GRAFT  2013  . left arm surgery as a child  87  . LEFT HEART CATHETERIZATION WITH CORONARY ANGIOGRAM N/A 12/01/2011   Procedure: LEFT HEART CATHETERIZATION WITH CORONARY ANGIOGRAM;  Surgeon: Hillary Bow, MD;  Location: [facility] CATH LAB;  Service: Cardiovascular;  Laterality: N/A;  . PERCUTANEOUS CORONARY INTERVENTION-BALLOON ONLY N/A 12/01/2011   Procedure: PERCUTANEOUS CORONARY INTERVENTION-BALLOON ONLY;  Surgeon: Hillary Bow, MD;  Location: [facility] CATH LAB;  Service:  Cardiovascular;  Laterality: N/A;     Current Outpatient Medications  Medication Sig Dispense Refill  . acyclovir (ZOVIRAX) 200 MG capsule TAKE 2 CAPSULES (400 MG TOTAL) BY MOUTH 3 (THREE) TIMES DAILY AS NEEDED FOR COLD SORES 90 capsule 2  . amLODipine (NORVASC) 10 MG tablet TAKE ONE TABLET BY MOUTH DAILY 90 tablet 0  . aspirin 81 MG tablet Take 81 mg by mouth daily.    Marland Kitchen atorvastatin (LIPITOR) 40 MG tablet TAKE ONE TABLET BY MOUTH DAILY AT 6PM 90 tablet 1  . carvedilol (COREG) 12.5 MG tablet Take 1 tablet (12.5 mg total) by mouth 2 (two) times daily with a meal. 180 tablet 3  . colchicine 0.6 MG tablet Take 1 tablet (0.6 mg total) by mouth daily. Please schedule visit with Roxy Cedar before further refills. 30 tablet 0  . glipiZIDE (GLUCOTROL) 5 MG tablet Take 7.5 mg by mouth 2 (two) times daily.    . Multiple Vitamin (MULITIVITAMIN WITH MINERALS) TABS Take 1 tablet by mouth daily.    . Omega-3 Fatty Acids (FISH OIL) 1200 MG CAPS Take 1,200 mg by mouth 3 (three) times daily.     Current Facility-Administered Medications  Medication Dose Route Frequency Provider Last Rate Last Admin  . 0.9 %  sodium chloride infusion  500 mL Intravenous Continuous Nelida Meuse III, MD        Allergies:   Lisinopril and Penicillins    ROS:  Please see the history of present illness.   Otherwise, review  of systems are positive for none.   All other systems are reviewed and negative.    PHYSICAL EXAM: VS:  BP 137/78   Pulse 60   Temp (!) 96.4 F (35.8 C)   Ht 5\' 11"  (1.803 m)   Wt 264 lb 12.8 oz (120.1 kg)   SpO2 97%   BMI 36.93 kg/m  , BMI Body mass index is 36.93 kg/m. GENERAL:  Well appearing NECK:  No jugular venous distention, waveform within normal limits, carotid upstroke brisk and symmetric, no bruits, no thyromegaly LUNGS:  Clear to auscultation bilaterally CHEST:  Well healed sternotomy scar. HEART:  PMI not displaced or sustained,S1 and S2 within normal limits, no S3, no S4,  no clicks, no rubs, no murmurs ABD:  Flat, positive bowel sounds normal in frequency in pitch, no bruits, no rebound, no guarding, no midline pulsatile mass, no hepatomegaly, no splenomegaly EXT:  2 plus pulses throughout, no edema, no cyanosis no clubbing   EKG:  EKG is ordered today. The ekg ordered today demonstrates sinus rhythm, rate 56, axis within normal limits, no acute ST-T wave changes.   Recent Labs: No results found for requested labs within last 8760 hours.    Lipid Panel    Component Value Date/Time   CHOL 148 08/03/2017 0845   TRIG 138 08/03/2017 0845   HDL 47 08/03/2017 0845   CHOLHDL 3.1 08/03/2017 0845   CHOLHDL 2.0 07/05/2016 1025   VLDL 9 07/05/2016 1025   LDLCALC 73 08/03/2017 0845      Wt Readings from Last 3 Encounters:  08/14/19 264 lb 12.8 oz (120.1 kg)  08/12/18 245 lb 6.4 oz (111.3 kg)  08/03/17 267 lb (121.1 kg)      Other studies Reviewed: Additional studies/ records that were reviewed today include: None. Review of the above records demonstrates:  Please see elsewhere in the note.     ASSESSMENT AND PLAN:  CAD:   The patient has no new sypmtoms.  No further cardiovascular testing is indicated.  We will continue with aggressive risk reduction and meds as listed.  He is not doing as much walking as I would like}.  OBESITY:  He has gained a little weight back.  DYSLIPIDEMIA: He will get a fasting lipid profile liver enzymes today.  DIABETES:    Per the request of his primary care we will check an A1c.  HTN:   The blood pressure is at target.  No change in therapy.   COVID 19 EDUCATION:   We talked about the vaccine.   Current medicines are reviewed at length with the patient today.  The patient does not have concerns regarding medicines.  The following changes have been made:  no change  Labs/ tests ordered today include:   Orders Placed This Encounter  Procedures  . Lipid Profile  . HgB A1c  . Comprehensive metabolic panel  .  CBC  . Urine Microalbumin w/creat. ratio  . EKG 12-Lead     Disposition:   FU with me in one year.     Signed, 08/05/17, MD  08/14/2019 10:32 AM    [facility]

## 2019-08-14 ENCOUNTER — Ambulatory Visit (INDEPENDENT_AMBULATORY_CARE_PROVIDER_SITE_OTHER): Payer: Medicare HMO | Admitting: Cardiology

## 2019-08-14 ENCOUNTER — Encounter: Payer: Self-pay | Admitting: Cardiology

## 2019-08-14 ENCOUNTER — Other Ambulatory Visit: Payer: Self-pay

## 2019-08-14 VITALS — BP 137/78 | HR 60 | Temp 96.4°F | Ht 71.0 in | Wt 264.8 lb

## 2019-08-14 DIAGNOSIS — E785 Hyperlipidemia, unspecified: Secondary | ICD-10-CM

## 2019-08-14 DIAGNOSIS — E118 Type 2 diabetes mellitus with unspecified complications: Secondary | ICD-10-CM

## 2019-08-14 DIAGNOSIS — Z7189 Other specified counseling: Secondary | ICD-10-CM

## 2019-08-14 DIAGNOSIS — Z0189 Encounter for other specified special examinations: Secondary | ICD-10-CM

## 2019-08-14 DIAGNOSIS — I2510 Atherosclerotic heart disease of native coronary artery without angina pectoris: Secondary | ICD-10-CM

## 2019-08-14 DIAGNOSIS — I10 Essential (primary) hypertension: Secondary | ICD-10-CM

## 2019-08-14 NOTE — Patient Instructions (Addendum)
Medication Instructions:  Your physician recommends that you continue on your current medications as directed. Please refer to the Current Medication list given to you today.  *If you need a refill on your cardiac medications before your next appointment, please call your pharmacy*  Lab Work: Your physician recommends that you have lab work today: [place] A1C URINE MICROALBUMIN [place]   If you have labs (blood work) drawn today and your tests are completely normal, you will receive your results only by: Marland Kitchen MyChart Message (if you have MyChart) OR . A paper copy in the mail If you have any lab test that is abnormal or we need to change your treatment, we will call you to review the results.  Testing/Procedures: NONE  Follow-Up: At [facility], you and your health needs are our priority.  As part of our continuing mission to provide you with exceptional heart care, we have created designated Provider Care Teams.  These Care Teams include your primary Cardiologist (physician) and Advanced Practice Providers (APPs -  Physician Assistants and Nurse Practitioners) who all work together to provide you with the care you need, when you need it.  Your next appointment:   12 month(s)  The format for your next appointment:   In Person  Provider:   Minus Breeding, MD

## 2019-08-15 LAB — COMPREHENSIVE METABOLIC PANEL
ALT: 17 IU/L (ref 0–44)
AST: 13 IU/L (ref 0–40)
Albumin/Globulin Ratio: 1.9 (ref 1.2–2.2)
Albumin: 4.3 g/dL (ref 3.7–4.7)
Alkaline Phosphatase: 109 IU/L (ref 39–117)
BUN/Creatinine Ratio: 23 (ref 10–24)
BUN: 16 mg/dL (ref 8–27)
Bilirubin Total: 0.9 mg/dL (ref 0.0–1.2)
CO2: 22 mmol/L (ref 20–29)
Calcium: 9.8 mg/dL (ref 8.6–10.2)
Chloride: 98 mmol/L (ref 96–106)
Creatinine, Ser: 0.69 mg/dL — ABNORMAL LOW (ref 0.76–1.27)
GFR calc Af Amer: 109 mL/min/{1.73_m2} (ref >59)
GFR calc non Af Amer: 94 mL/min/{1.73_m2} (ref >59)
Globulin, Total: 2.3 g/dL (ref 1.5–4.5)
Glucose: 282 mg/dL — ABNORMAL HIGH (ref 65–99)
Potassium: 4.4 mmol/L (ref 3.5–5.2)
Sodium: 136 mmol/L (ref 134–144)
Total Protein: 6.6 g/dL (ref 6.0–8.5)

## 2019-08-15 LAB — CBC
Hematocrit: 46.1 % (ref 37.5–51.0)
Hemoglobin: 15.7 g/dL (ref 13.0–17.7)
MCH: 31.6 pg (ref 26.6–33.0)
MCHC: 34.1 g/dL (ref 31.5–35.7)
MCV: 93 fL (ref 79–97)
Platelets: 194 10*3/uL (ref 150–450)
RBC: 4.97 x10E6/uL (ref 4.14–5.80)
RDW: 11.4 % — ABNORMAL LOW (ref 11.6–15.4)
WBC: 10.1 10*3/uL (ref 3.4–10.8)

## 2019-08-15 LAB — MICROALBUMIN / CREATININE URINE RATIO
Creatinine, Urine: 167.3 mg/dL
Microalb/Creat Ratio: 35 mg/g creat — ABNORMAL HIGH (ref 0–29)
Microalbumin, Urine: 58.7 ug/mL

## 2019-08-15 LAB — LIPID PANEL
Chol/HDL Ratio: 3.6 ratio (ref 0.0–5.0)
Cholesterol, Total: 135 mg/dL (ref 100–199)
HDL: 37 mg/dL — ABNORMAL LOW (ref >39)
LDL Chol Calc (NIH): 66 mg/dL (ref 0–99)
Triglycerides: 189 mg/dL — ABNORMAL HIGH (ref 0–149)
VLDL Cholesterol Cal: 32 mg/dL (ref 5–40)

## 2019-08-15 LAB — HEMOGLOBIN A1C
Est. average glucose Bld gHb Est-mCnc: 255 mg/dL
Hgb A1c MFr Bld: 10.5 % — ABNORMAL HIGH (ref 4.8–5.6)

## 2019-09-12 IMAGING — US US CAROTID DUPLEX BILAT
1 series · 13 of 24 positions shown · non-contrast
Comparison: None.

CLINICAL DATA: Hypertension.  Hyperlipidemia.

EXAM:
BILATERAL CAROTID DUPLEX ULTRASOUND
TECHNIQUE: Gray scale imaging, color Doppler and duplex ultrasound were
performed of bilateral carotid and vertebral arteries in the neck.

[Series 1: us carotid duplex bilat · 0.08mm/px · 13 of 48 slices shown]
[im 1/48]
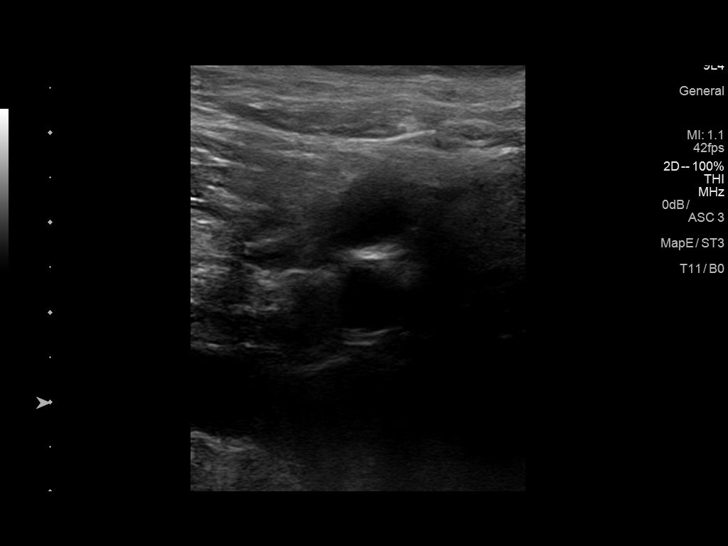
[im 5/48]
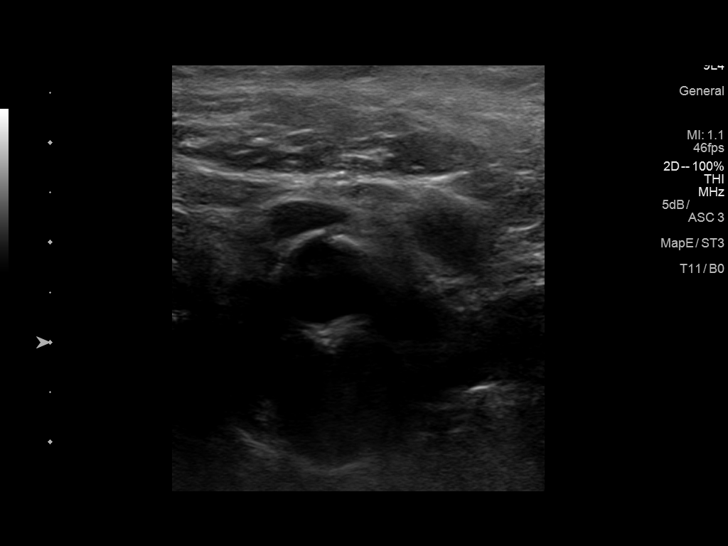
[im 9/48]
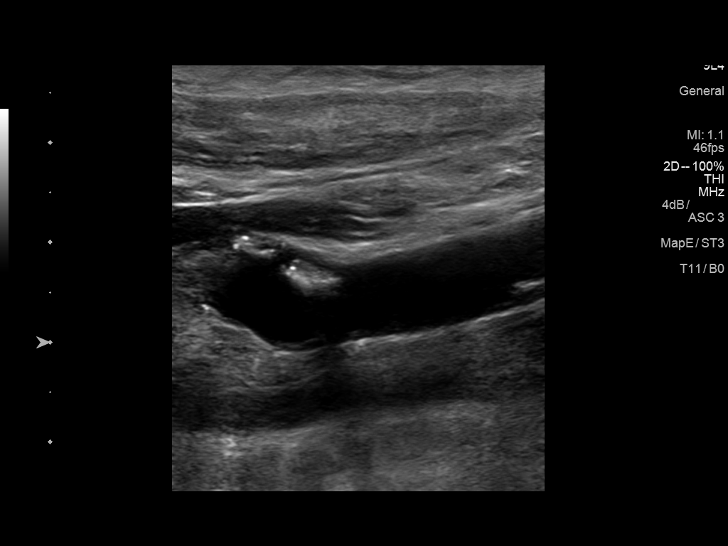
[im 13/48]
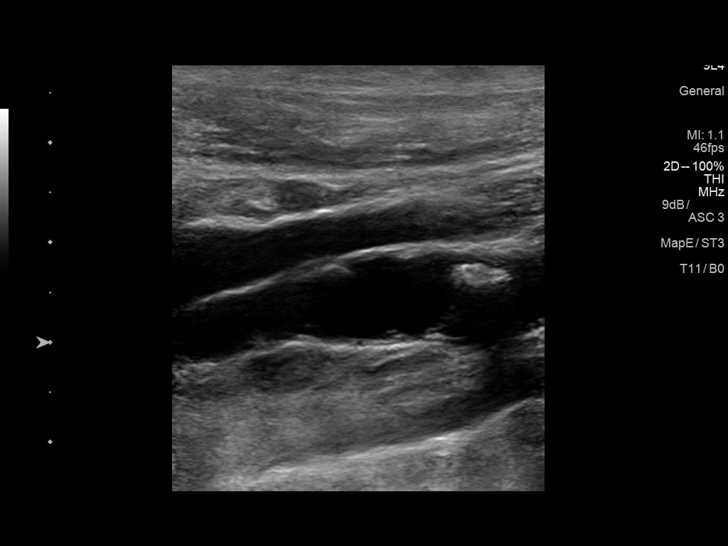
[im 17/48]
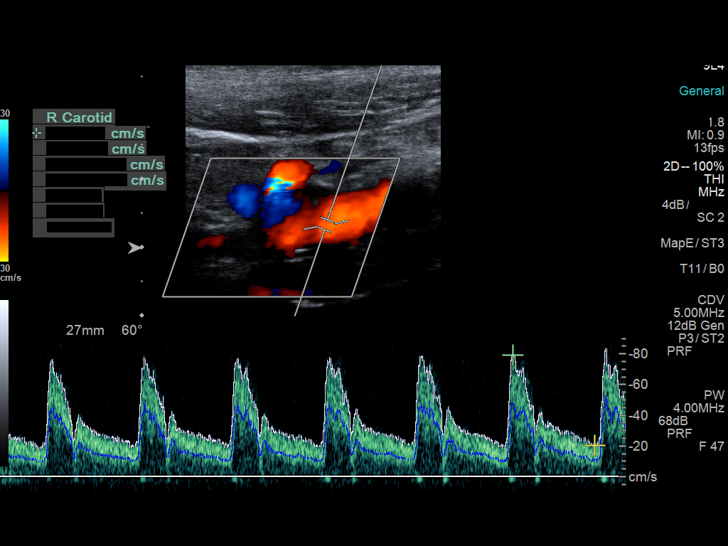
[im 21/48]
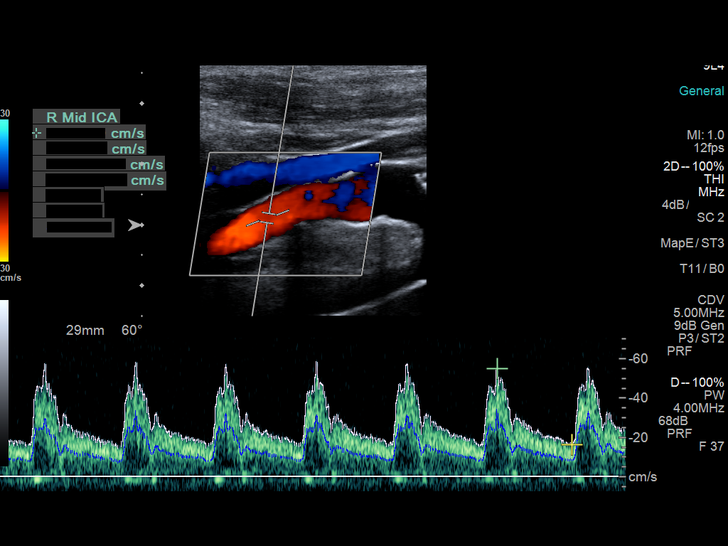
[im 25/48]
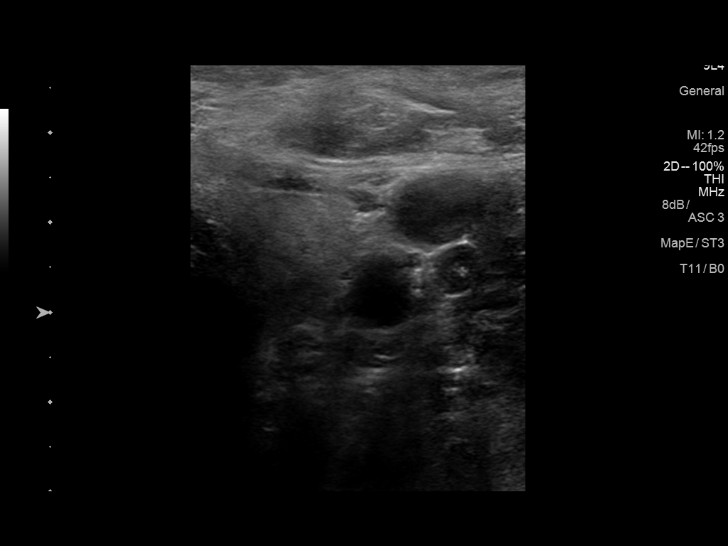
[im 27/48]
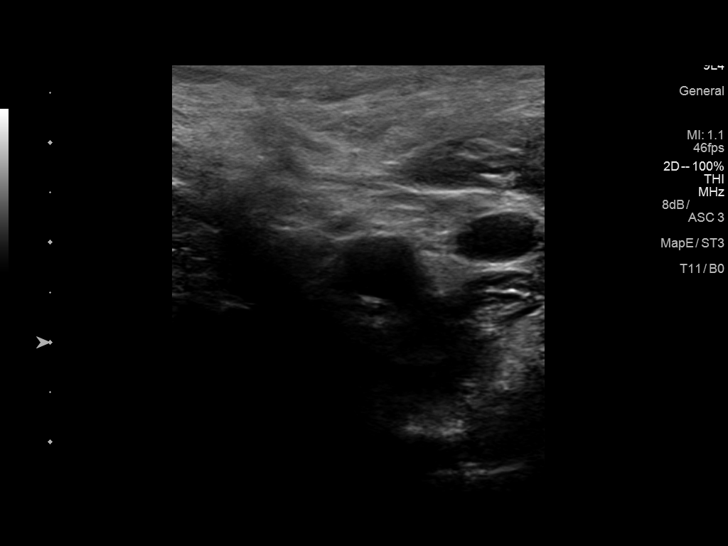
[im 31/48]
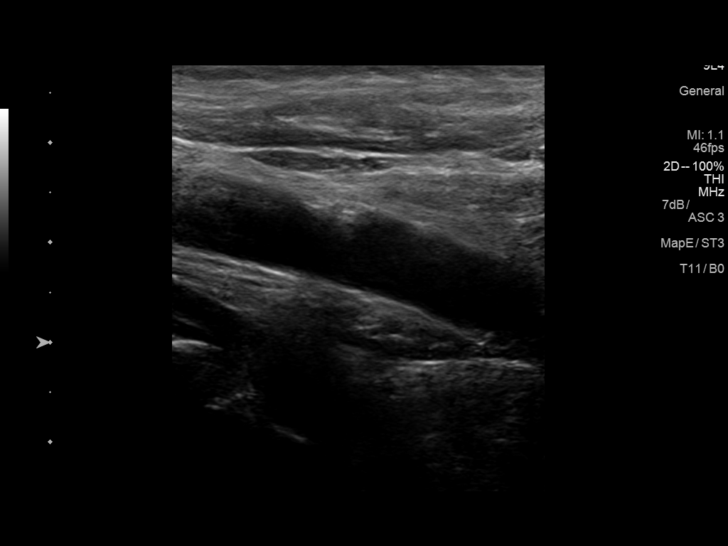
[im 35/48]
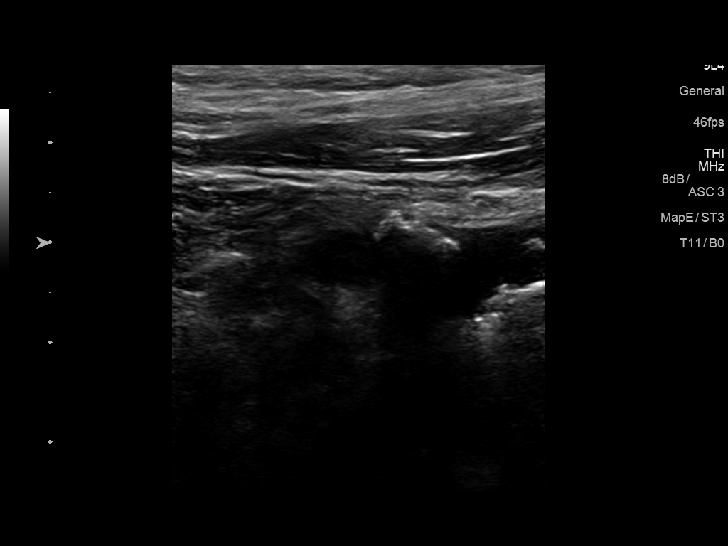
[im 39/48]
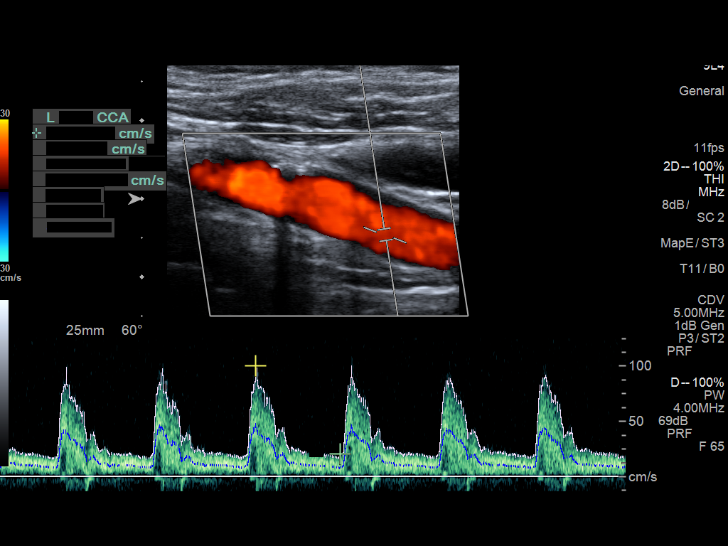
[im 43/48]
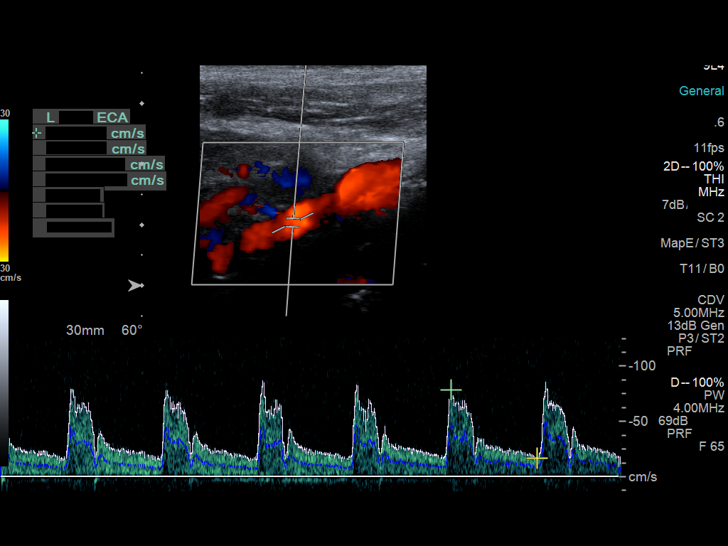
[im 48/48]
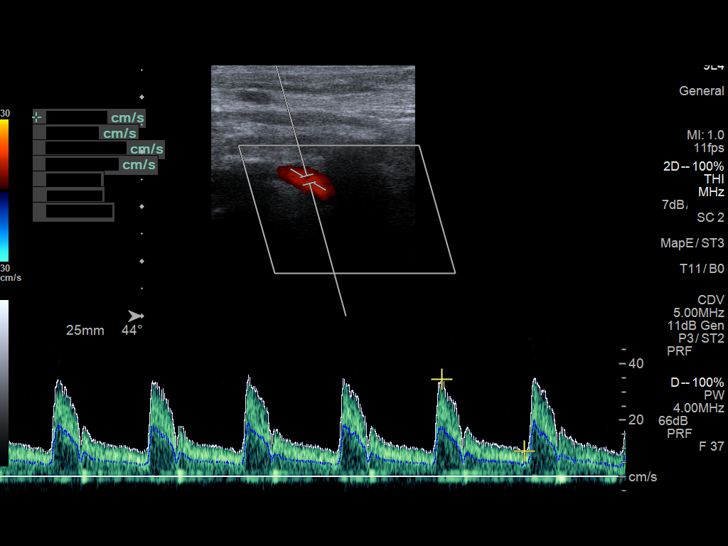

[13 of 24 positions shown; findings below may reference images not displayed]

FINDINGS: Criteria: Quantification of carotid stenosis is based on velocity
parameters that correlate the residual internal carotid diameter
with NASCET-based stenosis levels, using the diameter of the distal
internal carotid lumen as the denominator for stenosis measurement.

The following velocity measurements were obtained:

RIGHT

ICA: 72 cm/sec

CCA: 91 cm/sec

SYSTOLIC ICA/CCA RATIO:

ECA: 65 cm/sec

LEFT

ICA: 62 cm/sec

CCA: 100 cm/sec

SYSTOLIC ICA/CCA RATIO:

ECA: 78 cm/sec

RIGHT CAROTID ARTERY: Moderate calcified plaque in the mid common
carotid artery and bulb. Low resistance internal carotid Doppler
pattern is preserved.

RIGHT VERTEBRAL ARTERY:  Antegrade.

LEFT CAROTID ARTERY: There is moderate calcified plaque in the upper
common carotid artery and bulb. Low resistance internal carotid
Doppler pattern is preserved.

LEFT VERTEBRAL ARTERY:  Antegrade.

Upper extremity blood pressures: RIGHT: 154/84 LEFT: 154/94
IMPRESSION: Less than 50% stenosis in the right and left internal carotid
arteries.

## 2019-10-14 ENCOUNTER — Ambulatory Visit: Payer: Medicare HMO | Attending: Internal Medicine

## 2019-10-16 ENCOUNTER — Ambulatory Visit: Payer: Medicare HMO

## 2019-10-23 ENCOUNTER — Other Ambulatory Visit: Payer: Self-pay | Admitting: Cardiology

## 2019-10-31 ENCOUNTER — Other Ambulatory Visit: Payer: Self-pay | Admitting: Cardiology

## 2019-12-05 ENCOUNTER — Other Ambulatory Visit: Payer: Self-pay | Admitting: Cardiology

## 2019-12-31 ENCOUNTER — Other Ambulatory Visit: Payer: Self-pay | Admitting: Cardiology

## 2020-03-19 ENCOUNTER — Ambulatory Visit
Admission: RE | Admit: 2020-03-19 | Discharge: 2020-03-19 | Disposition: A | Discharge status: Home / Self Care | Payer: Medicare HMO | Source: Ambulatory Visit | Attending: Family Medicine | Admitting: Family Medicine

## 2020-03-19 ENCOUNTER — Other Ambulatory Visit: Payer: Self-pay

## 2020-03-19 ENCOUNTER — Other Ambulatory Visit: Payer: Self-pay | Admitting: Family Medicine

## 2020-03-19 DIAGNOSIS — M25561 Pain in right knee: Secondary | ICD-10-CM

## 2020-03-29 ENCOUNTER — Other Ambulatory Visit: Payer: Self-pay | Admitting: Cardiology

## 2020-07-22 ENCOUNTER — Other Ambulatory Visit: Payer: Self-pay | Admitting: Cardiology

## 2020-09-22 NOTE — Progress Notes (Signed)
Cardiology Office Note   Date:  09/23/2020   ID:  TYLOR COURTWRIGHT, DOB 1946/02/13, MRN [id number]  PCP:  Margot Ables, MD  Cardiologist:   Rollene Rotunda, MD  Chief Complaint  Patient presents with  . Coronary Artery Disease      History of Present Illness: Daniel [name] is a 75 y.o. male who presents for follow up of CAD/CABG.   Since I last saw him he has had no new cardiovascular complaints.  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 The patient denies any new symptoms such as chest discomfort, neck or arm discomfort. There has been no new shortness of breath, PND or orthopnea. There have been no reported palpitations, presyncope or syncope.     Past Medical History:  Diagnosis Date  . Atrial fibrillation ([facility]) 12/28/2011  . 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 Coronary artery disease   . Diabetes mellitus   . Diabetes mellitus type 2, noninsulin dependent ([facility])    a. diagnosed 2012  . GERD (gastroesophageal reflux disease)   . HTN (hypertension)   . Myocardial infarction ([facility]) 2013  . TOBACCO ABUSE 04/14/2008    Past Surgical History:  Procedure Laterality Date  . CARDIAC CATHETERIZATION     12-01-2011  . CORONARY ARTERY BYPASS GRAFT  12/13/2011   Procedure: CORONARY ARTERY BYPASS GRAFTING (CABG);  Surgeon: Alleen Borne, MD;  Location: [facility] OR;  Service: Open Heart Surgery;  Laterality: N/A;  Times five, on pump, using endoscopically harvested right greater saphenous vein and left internal mammary artery.   . CORONARY ARTERY BYPASS GRAFT  2013  . left arm surgery as a child  74  . LEFT HEART CATHETERIZATION WITH CORONARY ANGIOGRAM N/A 12/01/2011   Procedure: LEFT HEART CATHETERIZATION WITH CORONARY ANGIOGRAM;  Surgeon: Herby Abraham, MD;  Location[facility] CATH LAB;  Service: Cardiovascular;  Laterality: N/A;  . PERCUTANEOUS CORONARY INTERVENTION-BALLOON ONLY N/A 12/01/2011   Procedure: PERCUTANEOUS  CORONARY INTERVENTION-BALLOON ONLY;  Surgeon: Herby Abraham, MD;  Location: [facility] CATH LAB;  Service: Cardiovascular;  Laterality: N/A;     Current Outpatient Medications  Medication Sig Dispense Refill  . acyclovir (ZOVIRAX) 200 MG capsule TAKE 2 CAPSULES (400 MG TOTAL) BY MOUTH 3 (THREE) TIMES DAILY AS NEEDED FOR COLD SORES 90 capsule 2  . amLODipine (NORVASC) 10 MG tablet TAKE ONE TABLET BY MOUTH DAILY 90 tablet 3  . aspirin 81 MG tablet Take 81 mg by mouth daily.    Marland Kitchen atorvastatin (LIPITOR) 40 MG tablet TAKE ONE TABLET BY MOUTH DAILY 90 tablet 2  . carvedilol (COREG) 12.5 MG tablet TAKE ONE TABLET BY MOUTH TWICE A DAY WITH A MEAL 180 tablet 2  . colchicine 0.6 MG tablet Take 1 tablet (0.6 mg total) by mouth daily. Please schedule visit with Adline Mango before further refills. 30 tablet 0  . glipiZIDE (GLUCOTROL) 5 MG tablet Take 7.5 mg by mouth 2 (two) times daily.    . Multiple Vitamin (MULITIVITAMIN WITH MINERALS) TABS Take 1 tablet by mouth daily.    . Omega-3 Fatty Acids (FISH OIL) 1200 MG CAPS Take 1,200 mg by mouth 3 (three) times daily.     Current Facility-Administered Medications  Medication Dose Route Frequency Provider Last Rate Last Admin  . 0.9 %  sodium chloride infusion  500 mL Intravenous Continuous Danis, Starr Lake III, MD        Allergies:   Lisinopril and Penicillins    ROS:  Please  see the history of present illness.   Otherwise, review of systems are positive for none.   All other systems are reviewed and negative.    PHYSICAL EXAM: VS:  BP 140/86   Pulse (!) 57   Ht 5\' 11"  (1.803 m)   Wt 255 lb 9.6 oz (115.9 kg)   SpO2 97%   BMI 35.65 kg/m  , BMI Body mass index is 35.65 kg/m. GENERAL:  Well appearing NECK:  No jugular venous distention, waveform within normal limits, carotid upstroke brisk and symmetric, no bruits, no thyromegaly LUNGS:  Clear to auscultation bilaterally CHEST:  Well healed sternotomy scar. HEART:  PMI not displaced or  sustained,S1 and S2 within normal limits, no S3, no S4, no clicks, no rubs, no murmurs ABD:  Flat, positive bowel sounds normal in frequency in pitch, no bruits, no rebound, no guarding, no midline pulsatile mass, no hepatomegaly, no splenomegaly EXT:  2 plus pulses throughout, no edema, no cyanosis no clubbing   EKG:  EKG is  ordered today. The ekg ordered today demonstrates sinus rhythm, rate 57, axis within normal limits, no acute ST-T wave changes.   Recent Labs: No results found for requested labs within last 8760 hours.    Lipid Panel    Component Value Date/Time   CHOL 135 08/14/2019 1009   TRIG 189 (H) 08/14/2019 1009   HDL 37 (L) 08/14/2019 1009   CHOLHDL 3.6 08/14/2019 1009   CHOLHDL 2.0 07/05/2016 1025   VLDL 9 07/05/2016 1025   LDLCALC 66 08/14/2019 1009      Wt Readings from Last 3 Encounters:  09/23/20 255 lb 9.6 oz (115.9 kg)  08/14/19 264 lb 12.8 oz (120.1 kg)  08/12/18 245 lb 6.4 oz (111.3 kg)      Other studies Reviewed: Additional studies/ records that were reviewed today include: None. Review of the above records demonstrates: NA   ASSESSMENT AND PLAN:  CAD:   The patient has no new sypmtoms.  No further cardiovascular testing is indicated.  We will continue with aggressive risk reduction and meds as listed.  OBESITY:   He has lost a little weight.  I encouraged more of the same.   DYSLIPIDEMIA: I do not have his most recent lipids will call his office to get this.   DIABETES:    A1c was 6.7.  No change in therapy.  HTN:   The blood pressure was verified and elevated today.  He is going to keep a blood pressure diary because he says it is well controlled at home.  Further adjustments will be based on this.    Current medicines are reviewed at length with the patient today.  The patient does not have concerns regarding medicines.  The following changes have been made:  None  Labs/ tests ordered today include: None  Orders Placed This  Encounter  Procedures  . EKG 12-Lead     Disposition:   FU with me in one year.    Signed, 14/09/19, MD  09/23/2020 3:29 PM    [facility]

## 2020-09-23 ENCOUNTER — Ambulatory Visit (INDEPENDENT_AMBULATORY_CARE_PROVIDER_SITE_OTHER): Payer: Medicare HMO | Admitting: Cardiology

## 2020-09-23 ENCOUNTER — Encounter: Payer: Self-pay | Admitting: Cardiology

## 2020-09-23 ENCOUNTER — Other Ambulatory Visit: Payer: Self-pay

## 2020-09-23 VITALS — BP 140/86 | HR 57 | Ht 71.0 in | Wt 255.6 lb

## 2020-09-23 DIAGNOSIS — I10 Essential (primary) hypertension: Secondary | ICD-10-CM

## 2020-09-23 DIAGNOSIS — E118 Type 2 diabetes mellitus with unspecified complications: Secondary | ICD-10-CM | POA: Diagnosis not present

## 2020-09-23 DIAGNOSIS — E785 Hyperlipidemia, unspecified: Secondary | ICD-10-CM | POA: Diagnosis not present

## 2020-09-23 DIAGNOSIS — I2510 Atherosclerotic heart disease of native coronary artery without angina pectoris: Secondary | ICD-10-CM

## 2020-09-23 NOTE — Patient Instructions (Signed)
Medication Instructions:  The current medical regimen is effective;  continue present plan and medications.  *If you need a refill on your cardiac medications before your next appointment, please call your pharmacy*  Follow-Up: At [facility], you and your health needs are our priority.  As part of our continuing mission to provide you with exceptional heart care, we have created designated Provider Care Teams.  These Care Teams include your primary Cardiologist (physician) and Advanced Practice Providers (APPs -  Physician Assistants and Nurse Practitioners) who all work together to provide you with the care you need, when you need it.  We recommend signing up for the patient portal called "MyChart".  Sign up information is provided on this After Visit Summary.  MyChart is used to connect with patients for Virtual Visits (Telemedicine).  Patients are able to view lab/test results, encounter notes, upcoming appointments, etc.  Non-urgent messages can be sent to your provider as well.   To learn more about what you can do with MyChart, go to [email or web address].    Your next appointment:   12 month(s)  The format for your next appointment:   In Person  Provider:   Rollene Rotunda, MD   Other Instructions Keep a BP log.

## 2020-11-01 ENCOUNTER — Other Ambulatory Visit: Payer: Self-pay | Admitting: Cardiology

## 2020-11-19 ENCOUNTER — Other Ambulatory Visit: Payer: Self-pay | Admitting: Cardiology

## 2020-11-22 ENCOUNTER — Other Ambulatory Visit: Payer: Self-pay

## 2021-02-05 ENCOUNTER — Other Ambulatory Visit: Payer: Self-pay | Admitting: Cardiology

## 2021-09-25 NOTE — Progress Notes (Signed)
Cardiology Office Note   Date:  09/26/2021   ID:  MY SIDEL, DOB Nov 16, 1945, MRN [id number]  PCP:  Margot Ables, MD  Cardiologist:   Rollene Rotunda, MD  Chief Complaint  Patient presents with   Follow-up   Coronary Artery Disease      History of Present Illness: Daniel [name] is a 76 y.o. male who presents for follow up of CAD/CABG.   Since I last saw him he has done well.  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 The patient denies any new symptoms such as chest discomfort, neck or arm discomfort. There has been no new shortness of breath, PND or orthopnea. There have been no reported palpitations, presyncope or syncope.    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 diagnosed 2012   GERD (gastroesophageal reflux disease)    HTN (hypertension)    Myocardial infarction ([facility]) 2013   TOBACCO ABUSE 04/14/2008    Past Surgical History:  Procedure Laterality Date   CARDIAC CATHETERIZATION     12-01-2011   CORONARY ARTERY BYPASS GRAFT  12/13/2011   Procedure: CORONARY ARTERY BYPASS GRAFTING (CABG);  Surgeon: Alleen Borne, MD;  Location: [facility] OR;  Service: Open Heart Surgery;  Laterality: N/A;  Times five, on pump, using endoscopically harvested right greater saphenous vein and left internal mammary artery.    CORONARY ARTERY BYPASS GRAFT  2013   left arm surgery as a child  1957   LEFT HEART CATHETERIZATION WITH CORONARY ANGIOGRAM N/A 12/01/2011   Procedure: LEFT HEART CATHETERIZATION WITH CORONARY ANGIOGRAM;  Surgeon: Herby Abraham, MD;  Location: [facility] CATH LAB;  Service: Cardiovascular;  Laterality: N/A;   PERCUTANEOUS CORONARY INTERVENTION-BALLOON ONLY N/A 12/01/2011   Procedure: PERCUTANEOUS CORONARY INTERVENTION-BALLOON ONLY;  Surgeon: Herby Abraham, MD;  Location: [facility] CATH LAB;  Service: Cardiovascular;  Laterality: N/A;     Current Outpatient Medications  Medication Sig Dispense Refill   amLODipine (NORVASC) 10 MG tablet TAKE ONE TABLET BY MOUTH DAILY 90 tablet 3   aspirin 81 MG tablet Take 81 mg by mouth daily.     atorvastatin (LIPITOR) 40 MG tablet TAKE ONE TABLET BY MOUTH DAILY 90 tablet 3   carvedilol (COREG) 12.5 MG tablet TAKE ONE TABLET BY MOUTH TWICE A DAY WITH A MEAL 180 tablet 2   colchicine 0.6 MG tablet Take 1 tablet (0.6 mg total) by mouth daily. Please schedule visit with Adline Mango before further refills. 30 tablet 0   Multiple Vitamin (MULITIVITAMIN WITH MINERALS) TABS Take 1 tablet by mouth daily.     acyclovir (ZOVIRAX) 200 MG capsule TAKE 2 CAPSULES (400 MG TOTAL) BY MOUTH 3 (THREE) TIMES DAILY AS NEEDED FOR COLD SORES (Patient not taking: Reported on 09/26/2021) 90 capsule 2   Omega-3 Fatty Acids (FISH OIL) 1200 MG CAPS Take 1,200 mg by mouth 3 (three) times daily. (Patient not taking: Reported on 09/26/2021)     Current Facility-Administered Medications  Medication Dose Route Frequency Provider Last Rate Last Admin   0.9 %  sodium chloride infusion  500 mL Intravenous Continuous Danis, Starr Lake III, MD        Allergies:   Lisinopril and Penicillins    ROS:  Please see the history of present illness.   Otherwise, review  of systems are positive for none.   All other systems are reviewed and negative.    PHYSICAL EXAM: VS:  BP 116/70    Pulse (!) 53    Ht 5' 11.5" (1.816 m)    Wt 223 lb (101.2 kg)    SpO2 97%    BMI 30.67 kg/m  , BMI Body mass index is 30.67 kg/m. GENERAL:  Well appearing NECK:  No jugular venous distention, waveform within normal limits, carotid upstroke brisk and symmetric, no bruits, no thyromegaly LUNGS:  Clear to auscultation bilaterally CHEST:  Unremarkable HEART:  PMI not displaced or sustained,S1 and S2 within normal limits, no S3, no S4, no clicks, no rubs, no  murmurs ABD:  Flat, positive bowel sounds normal in frequency in pitch, no bruits, no rebound, no guarding, no midline pulsatile mass, no hepatomegaly, no splenomegaly EXT:  2 plus pulses throughout, no edema, no cyanosis no clubbing   EKG:  EKG is  ordered today. The ekg ordered today demonstrates sinus rhythm, rate 87, axis within normal limits, no acute ST-T wave changes.   Recent Labs: No results found for requested labs within last 8760 hours.    Lipid Panel    Component Value Date/Time   CHOL 135 08/14/2019 1009   TRIG 189 (H) 08/14/2019 1009   HDL 37 (L) 08/14/2019 1009   CHOLHDL 3.6 08/14/2019 1009   CHOLHDL 2.0 07/05/2016 1025   VLDL 9 07/05/2016 1025   LDLCALC 66 08/14/2019 1009      Wt Readings from Last 3 Encounters:  09/26/21 223 lb (101.2 kg)  09/23/20 255 lb 9.6 oz (115.9 kg)  08/14/19 264 lb 12.8 oz (120.1 kg)      Other studies Reviewed: Additional studies/ records that were reviewed today include: Labs from the primary office. Review of the above records demonstrates: See elsewhere   ASSESSMENT AND PLAN:  CAD:   The patient has no new sypmtoms.  No further cardiovascular testing is indicated.  We will continue with aggressive risk reduction and meds as listed.  OBESITY:    He has lost about 40 pounds and I am very proud of this.  He has done this by quitting wine and decreasing calories and continuing his walking.   DYSLIPIDEMIA:   I was able to find blood work.  He has done yearly by his primary.  His last LDL was 53 with an HDL of 44.  No change  DIABETES:    A1c was 4.7.  His primary discontinued his metformin and his glipizide.   HTN:   The blood pressure was well controlled.  No change in therapy.   Current medicines are reviewed at length with the patient today.  The patient does not have concerns regarding medicines.  The following changes have been made:  None  Labs/ tests ordered today include: None  Orders Placed This Encounter   Procedures   EKG 12-Lead     Disposition:   FU with me in one year.    Signed, Minus Breeding, MD  09/26/2021 8:59 AM    [facility]

## 2021-09-26 ENCOUNTER — Other Ambulatory Visit: Payer: Self-pay

## 2021-09-26 ENCOUNTER — Encounter: Payer: Self-pay | Admitting: Cardiology

## 2021-09-26 ENCOUNTER — Ambulatory Visit: Payer: Medicare HMO | Admitting: Cardiology

## 2021-09-26 VITALS — BP 116/70 | HR 53 | Ht 71.5 in | Wt 223.0 lb

## 2021-09-26 DIAGNOSIS — E785 Hyperlipidemia, unspecified: Secondary | ICD-10-CM

## 2021-09-26 DIAGNOSIS — I2510 Atherosclerotic heart disease of native coronary artery without angina pectoris: Secondary | ICD-10-CM | POA: Diagnosis not present

## 2021-09-26 DIAGNOSIS — E118 Type 2 diabetes mellitus with unspecified complications: Secondary | ICD-10-CM | POA: Diagnosis not present

## 2021-09-26 NOTE — Patient Instructions (Signed)
Medication Instructions:  ?Your physician recommends that you continue on your current medications as directed. Please refer to the Current Medication list given to you today.  ? ?*If you need a refill on your cardiac medications before your next appointment, please call your pharmacy* ? ?Lab Work: ?NONE  ? ?Testing/Procedures: ?NONE ? ?Follow-Up: ?At [facility], you and your health needs are our priority.  As part of our continuing mission to provide you with exceptional heart care, we have created designated Provider Care Teams.  These Care Teams include your primary Cardiologist (physician) and Advanced Practice Providers (APPs -  Physician Assistants and Nurse Practitioners) who all work together to provide you with the care you need, when you need it. ? ?We recommend signing up for the patient portal called "MyChart".  Sign up information is provided on this After Visit Summary.  MyChart is used to connect with patients for Virtual Visits (Telemedicine).  Patients are able to view lab/test results, encounter notes, upcoming appointments, etc.  Non-urgent messages can be sent to your provider as well.   ?To learn more about what you can do with MyChart, go to [email or web address].   ? ?Your next appointment:   ?12 month(s) ? ?The format for your next appointment:   ?In Person ? ?Provider:   ?James Hochrein, MD { ? ?

## 2021-10-20 ENCOUNTER — Other Ambulatory Visit: Payer: Self-pay | Admitting: Cardiology

## 2022-07-06 LAB — LAB REPORT - SCANNED: EGFR: 98

## 2022-10-02 NOTE — Progress Notes (Unsigned)
Cardiology Office Note   Date:  10/03/2022   ID:  Daniel [name], DOB 1945-10-27, MRN [id number]  PCP:  Berneta Levins, DO  Cardiologist:   Minus Breeding, MD  Chief Complaint  Patient presents with   Coronary Artery Disease      History of Present Illness: Daniel [name] is a 77 y.o. male who presents for follow up of CAD/CABG.   Since I last saw him he has done well. The patient denies any new symptoms such as chest discomfort, neck or arm discomfort. There has been no new shortness of breath, PND or orthopnea. There have been no reported palpitations, presyncope or syncope.  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 The patient denies any new symptoms such as chest discomfort, neck or arm discomfort. There has been no new shortness of breath, PND or orthopnea. There have been no reported palpitations, presyncope or syncope.     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 diagnosed 2012   GERD (gastroesophageal reflux disease)    HTN (hypertension)    Myocardial infarction ([place]) 2013   TOBACCO ABUSE 04/14/2008    Past Surgical History:  Procedure Laterality Date   CARDIAC CATHETERIZATION     12-01-2011   CORONARY ARTERY BYPASS GRAFT  12/13/2011   Procedure: CORONARY ARTERY BYPASS GRAFTING (CABG);  Surgeon: Gaye Pollack, MD;  Location: [facility];  Service: Open Heart Surgery;  Laterality: N/A;  Times five, on pump, using endoscopically harvested right greater saphenous vein and left internal mammary artery.    CORONARY ARTERY BYPASS GRAFT  2013   left arm surgery as a child  [place] N/A 12/01/2011   Procedure: LEFT HEART CATHETERIZATION WITH CORONARY ANGIOGRAM;  Surgeon: Hillary Bow, MD;  Location: [facility] CATH LAB;   Service: Cardiovascular;  Laterality: N/A;   PERCUTANEOUS CORONARY INTERVENTION-BALLOON ONLY N/A 12/01/2011   Procedure: PERCUTANEOUS CORONARY INTERVENTION-BALLOON ONLY;  Surgeon: Hillary Bow, MD;  Location: [facility] CATH LAB;  Service: Cardiovascular;  Laterality: N/A;     Current Outpatient Medications  Medication Sig Dispense Refill   acyclovir (ZOVIRAX) 200 MG capsule TAKE 2 CAPSULES (400 MG TOTAL) BY MOUTH 3 (THREE) TIMES DAILY AS NEEDED FOR COLD SORES 90 capsule 2   amLODipine (NORVASC) 10 MG tablet TAKE ONE TABLET BY MOUTH DAILY 90 tablet 3   aspirin 81 MG tablet Take 81 mg by mouth daily.     atorvastatin (LIPITOR) 40 MG tablet TAKE ONE TABLET BY MOUTH DAILY 90 tablet 3   carvedilol (COREG) 12.5 MG tablet TAKE ONE TABLET BY MOUTH TWICE A DAY WITH MEALS 180 tablet 3   colchicine 0.6 MG tablet Take 1 tablet (0.6 mg total) by mouth daily. Please schedule visit with Roxy Cedar before further refills. 30 tablet 0   Multiple Vitamin (MULITIVITAMIN WITH MINERALS) TABS Take 1 tablet by mouth daily.     Omega-3 Fatty Acids (FISH OIL) 1200 MG CAPS Take 1,200 mg by mouth 3 (three) times daily.     Current Facility-Administered Medications  Medication Dose Route Frequency Provider Last Rate Last Admin   0.9 %  sodium chloride infusion  500 mL Intravenous Continuous Danis, Kirke Corin, MD        Allergies:  Lisinopril and Penicillins    ROS:  Please see the history of present illness.   Otherwise, review of systems are positive for none.   All other systems are reviewed and negative.    PHYSICAL EXAM: VS:  BP 128/72 (BP Location: Left Arm, Patient Position: Sitting, Cuff Size: Large)   Pulse (!) 55   Ht 5\' 11"  (1.803 m)   Wt 234 lb 6.4 oz (106.3 kg)   SpO2 97%   BMI 32.69 kg/m  , BMI Body mass index is 32.69 kg/m. GENERAL:  Well appearing NECK:  No jugular venous distention, waveform within normal limits, carotid upstroke brisk and symmetric, no bruits, no thyromegaly LUNGS:   Clear to auscultation bilaterally CHEST:  Well healed sternotomy scar. HEART:  PMI not displaced or sustained,S1 and S2 within normal limits, no S3, no S4, no clicks, no rubs, no murmurs ABD:  Flat, positive bowel sounds normal in frequency in pitch, no bruits, no rebound, no guarding, no midline pulsatile mass, no hepatomegaly, no splenomegaly EXT:  2 plus pulses throughout, no edema, no cyanosis no clubbing   EKG:  EKG is ordered today. The ekg ordered today demonstrates sinus rhythm, rate 55, axis within normal limits, no acute ST-T wave changes.   Recent Labs: No results found for requested labs within last 365 days.    Lipid Panel    Component Value Date/Time   CHOL 135 08/14/2019 1009   TRIG 189 (H) 08/14/2019 1009   HDL 37 (L) 08/14/2019 1009   CHOLHDL 3.6 08/14/2019 1009   CHOLHDL 2.0 07/05/2016 1025   VLDL 9 07/05/2016 1025   LDLCALC 66 08/14/2019 1009      Wt Readings from Last 3 Encounters:  10/03/22 234 lb 6.4 oz (106.3 kg)  09/26/21 223 lb (101.2 kg)  09/23/20 255 lb 9.6 oz (115.9 kg)      Other studies Reviewed: Additional studies/ records that were reviewed today include: None Review of the above records demonstrates: NA   ASSESSMENT AND PLAN:  CAD:  The patient has no new sypmtoms.  No further cardiovascular testing is indicated.  We will continue with aggressive risk reduction and meds as listed.  OBESITY:    He  He has lost about 40 pounds although he gained a little bit back.  He still down below his peak and I encouraged continued lifestyle modification for weight loss.   DYSLIPIDEMIA:    LDL was good last year and will be checked again in May.  I would like to see these results with a goal LDL in the 50s.  He was previously 28.   DIABETES:    A1c was 4.7.  No change in therapy.   HTN:   The blood pressure is at target.  No change in therapy.  Current medicines are reviewed at length with the patient today.  The patient does not have concerns  regarding medicines.  The following changes have been made:  None  Labs/ tests ordered today include: None  Orders Placed This Encounter  Procedures   EKG 12-Lead     Disposition:   FU with me in one year.    Signed, Minus Breeding, MD  10/03/2022 3:22 PM     [facility]

## 2022-10-03 ENCOUNTER — Ambulatory Visit: Payer: Medicare HMO | Attending: Cardiology | Admitting: Cardiology

## 2022-10-03 ENCOUNTER — Encounter: Payer: Self-pay | Admitting: Cardiology

## 2022-10-03 VITALS — BP 128/72 | HR 55 | Ht 71.0 in | Wt 234.4 lb

## 2022-10-03 DIAGNOSIS — E785 Hyperlipidemia, unspecified: Secondary | ICD-10-CM

## 2022-10-03 DIAGNOSIS — I10 Essential (primary) hypertension: Secondary | ICD-10-CM | POA: Diagnosis not present

## 2022-10-03 DIAGNOSIS — I2510 Atherosclerotic heart disease of native coronary artery without angina pectoris: Secondary | ICD-10-CM

## 2022-10-03 DIAGNOSIS — E118 Type 2 diabetes mellitus with unspecified complications: Secondary | ICD-10-CM | POA: Diagnosis not present

## 2022-10-03 NOTE — Patient Instructions (Signed)
    Follow-Up: At [facility], you and your health needs are our priority.  As part of our continuing mission to provide you with exceptional heart care, we have created designated Provider Care Teams.  These Care Teams include your primary Cardiologist (physician) and Advanced Practice Providers (APPs -  Physician Assistants and Nurse Practitioners) who all work together to provide you with the care you need, when you need it.  We recommend signing up for the patient portal called "MyChart".  Sign up information is provided on this After Visit Summary.  MyChart is used to connect with patients for Virtual Visits (Telemedicine).  Patients are able to view lab/test results, encounter notes, upcoming appointments, etc.  Non-urgent messages can be sent to your provider as well.   To learn more about what you can do with MyChart, go to [email or web address].    Your next appointment:   12 month(s)  Provider:   Minus Breeding, MD

## 2022-10-12 ENCOUNTER — Other Ambulatory Visit: Payer: Self-pay | Admitting: Cardiology

## 2023-10-04 ENCOUNTER — Other Ambulatory Visit: Payer: Self-pay | Admitting: Cardiology

## 2023-10-04 ENCOUNTER — Telehealth: Payer: Self-pay | Admitting: Cardiology

## 2023-10-04 MED ORDER — AMLODIPINE BESYLATE 10 MG PO TABS: 10.0000 mg | ORAL_TABLET | Freq: Every day | ORAL | 0 refills | Status: DC

## 2023-10-04 MED ORDER — ATORVASTATIN CALCIUM 40 MG PO TABS: 40.0000 mg | ORAL_TABLET | Freq: Every day | ORAL | 0 refills | Status: DC

## 2023-10-04 MED ORDER — CARVEDILOL 12.5 MG PO TABS: 12.5000 mg | ORAL_TABLET | Freq: Two times a day (BID) | ORAL | 0 refills | Status: DC

## 2023-10-04 NOTE — Telephone Encounter (Signed)
Pt's medications was sent to pt's pharmacy as requested. Confirmation received.

## 2023-10-04 NOTE — Telephone Encounter (Signed)
*  STAT* If patient is at the pharmacy, call can be transferred to refill team.   1. Which medications need to be refilled? (please list name of each medication and dose if known)  amLODipine (NORVASC) 10 MG tablet   carvedilol (COREG) 12.5 MG tablet    atorvastatin (LIPITOR) 40 MG tablet     4. Which pharmacy/location (including street and city if local pharmacy) is medication to be sent to? [facility] [id number] - [place], [place] - [address]     5. Do they need a 30 day or 90 day supply? 90  Pt is scheduled for 11/01/23 with [place], PA

## 2023-10-30 NOTE — Progress Notes (Unsigned)
 Cardiology Office Note:    Date:  11/01/2023   ID:  CONNAR KEATING, DOB 1946-05-15, MRN [id number]  PCP:  Theodis Shove, DO   [facility] Providers Cardiologist:  Rollene Rotunda, MD Cardiology APP:  Marcelino Duster, PA     Referring MD: Theodis Shove, *   Chief Complaint  Patient presents with   Follow-up    CAD s/p CABG    History of Present Illness:    Daniel [name] is a 78 y.o. male with a hx of CAD s/p CABG, atrial fibrillation, Carotid artery disease, DM2, GERD, hypertension, and prior tobacco abuse.  He underwent CABG x 5 in 2013.   He is maintained on 10 mg amlodipine, 40 mg Lipitor, 81 mg ASA, 12.5 mg coreg BID.   It does not appear he has had follow-up ischemic evaluation.  He presents today for routine cardiology follow-up.  He has no cardiac complaints. 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 His A1c was elevated with PCP to 8% and he just started mounjaro.    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 diagnosed 2012   GERD (gastroesophageal reflux disease)    HTN (hypertension)    Myocardial infarction ([facility]) 2013   TOBACCO ABUSE 04/14/2008    Past Surgical History:  Procedure Laterality Date   CARDIAC CATHETERIZATION     12-01-2011   CORONARY ARTERY BYPASS GRAFT  12/13/2011   Procedure: CORONARY ARTERY BYPASS GRAFTING (CABG);  Surgeon: Alleen Borne, MD;  Location: [facility] OR;  Service: Open Heart Surgery;  Laterality: N/A;  Times five, on pump, using endoscopically harvested right greater saphenous vein and left internal mammary artery.    CORONARY ARTERY BYPASS GRAFT  2013   left arm surgery as a child  1957   LEFT HEART CATHETERIZATION WITH CORONARY ANGIOGRAM N/A 12/01/2011   Procedure: LEFT  HEART CATHETERIZATION WITH CORONARY ANGIOGRAM;  Surgeon: Herby Abraham, MD;  Location: [facility] CATH LAB;  Service: Cardiovascular;  Laterality: N/A;   PERCUTANEOUS CORONARY INTERVENTION-BALLOON ONLY N/A 12/01/2011   Procedure: PERCUTANEOUS CORONARY INTERVENTION-BALLOON ONLY;  Surgeon: Herby Abraham, MD;  Location: [facility] CATH LAB;  Service: Cardiovascular;  Laterality: N/A;    Current Medications: Current Meds  Medication Sig   acyclovir (ZOVIRAX) 200 MG capsule TAKE 2 CAPSULES (400 MG TOTAL) BY MOUTH 3 (THREE) TIMES DAILY AS NEEDED FOR COLD SORES   amLODipine (NORVASC) 10 MG tablet Take 1 tablet (10 mg total) by mouth daily.   aspirin 81 MG tablet Take 81 mg by mouth daily.   atorvastatin (LIPITOR) 40 MG tablet Take 1 tablet (40 mg total) by mouth daily.   carvedilol (COREG) 12.5 MG tablet Take 1 tablet (12.5 mg total) by mouth 2 (two) times daily with a meal.   MOUNJARO 2.5 MG/0.5ML Pen Inject 2.5 mg into the skin once a week.   Multiple Vitamin (MULITIVITAMIN WITH MINERALS) TABS Take 1 tablet by mouth daily.   Omega-3 Fatty Acids (FISH OIL) 1200 MG CAPS Take 1,200 mg by mouth 3 (three) times daily.   traZODone (DESYREL) 50 MG tablet Take 100 mg by mouth at bedtime as needed.   Current Facility-Administered Medications for the 11/01/23 encounter (Office Visit) with Marcelino Duster, PA  Medication  0.9 %  sodium chloride infusion     Allergies:   Lisinopril and Penicillins   Social History   Socioeconomic History   Marital status: Widowed    Spouse name: Not on file   Number of children: Not on file   Years of education: Not on file   Highest education level: Not on file  Occupational History   Not on file  Tobacco Use   Smoking status: Former    Types: Cigars    Quit date: 2013    Years since quitting: 12.1   Smokeless tobacco: Never   Tobacco comments:    smokes 1 cigar/day for many years  Substance and Sexual Activity   Alcohol use: Yes    Alcohol/week: 3.0 - 5.0  standard drinks of alcohol    Types: 3 - 5 Standard drinks or equivalent per week   Drug use: No   Sexual activity: Not Currently  Other Topics Concern   Not on file  Social History Narrative   Pt lives by himself in [place].  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  He does not routinely exercise or adhere to any particular diet.   Social Drivers of Corporate investment banker Strain: Not on file  Food Insecurity: Not on file  Transportation Needs: Not on file  Physical Activity: Not on file  Stress: Not on file  Social Connections: Not on file     Family History: The patient's family history includes Heart attack in his brother, brother, father, and mother. There is no history of Anesthesia problems, Hypotension, Malignant hyperthermia, or Pseudochol deficiency.  ROS:   Please see the history of present illness.     All other systems reviewed and are negative.  EKGs/Labs/Other Studies Reviewed:    The following studies were reviewed today:  EKG Interpretation Date/Time:  Thursday November 01 2023 11:31:47 EST Ventricular Rate:  62 PR Interval:  240 QRS Duration:  98 QT Interval:  440 QTC Calculation: 446 R Axis:   -35  Text Interpretation: Sinus rhythm with 1st degree A-V block Left axis deviation Minimal voltage criteria for LVH, may be normal variant ( Cornell product ) When compared with ECG of 15-Dec-2011 23:33, Significant changes have occurred Confirmed by Micah Flesher ([id number]) on 11/01/2023 11:51:58 AM    Recent Labs: No results found for requested labs within last 365 days.  Recent Lipid Panel    Component Value Date/Time   CHOL 135 08/14/2019 1009   TRIG 189 (H) 08/14/2019 1009   HDL 37 (L) 08/14/2019 1009   CHOLHDL 3.6 08/14/2019 1009   CHOLHDL 2.0 07/05/2016 1025   VLDL 9 07/05/2016 1025   LDLCALC 66 08/14/2019 1009     Risk Assessment/Calculations:                Physical Exam:    VS:  BP 130/80   Pulse 62   Ht 5\' 11"  (1.803 m)   Wt 241 lb 3.2 oz (109.4  kg)   SpO2 100%   BMI 33.64 kg/m     Wt Readings from Last 3 Encounters:  11/01/23 241 lb 3.2 oz (109.4 kg)  10/03/22 234 lb 6.4 oz (106.3 kg)  09/26/21 223 lb (101.2 kg)     GEN:  Well nourished, well developed in no acute distress HEENT: Normal NECK: No JVD; No carotid bruits LYMPHATICS: No lymphadenopathy CARDIAC: RRR, no murmurs, rubs, gallops RESPIRATORY:  Clear to auscultation without rales, wheezing or rhonchi  ABDOMEN: Soft, non-tender, non-distended MUSCULOSKELETAL:  No  edema; No deformity  SKIN: Warm and dry NEUROLOGIC:  Alert and oriented x 3 PSYCHIATRIC:  Normal affect   ASSESSMENT:    1. Essential hypertension   2. S/P CABG x 5   3. Hyperlipidemia with target LDL less than 70   4. Diabetes mellitus type 2, noninsulin dependent (HCC)    PLAN:    In order of problems listed above:  CAD s/p CABG x 5 in 2013 In the setting of ACS presentation Post op Afib, no issues since, not on OAC - walks 1.5 miles without angina - will defer ischemic testing for now   Hypertension - well controlled - continue 10 mg amlodipine, 12.5 mg coreg BID - was controlled on recheck   Hyperlipidemia with LDL goal < 70 Needs updated lipid panel Continue 40 mg lipitor - just had labs three weeks ago, have not been sent over - goes to [facility] [address] - consider adding LPA with next labs   DM - A1c 8%, just started mounjaro   Follow up with Dr. Antoine Poche in 1 year.          Medication Adjustments/Labs and Tests Ordered: Current medicines are reviewed at length with the patient today.  Concerns regarding medicines are outlined above.  Orders Placed This Encounter  Procedures   EKG 12-Lead   No orders of the defined types were placed in this encounter.   Patient Instructions  Medication Instructions:  Your physician recommends that you continue on your current medications as directed. Please refer to the Current Medication list given to you  today.    Lab Work: NONE ordered at this time of appointment    Testing/Procedures: NONE ordered at this time of appointment   Follow-Up: At [facility], you and your health needs are our priority.  As part of our continuing mission to provide you with exceptional heart care, we have created designated Provider Care Teams.  These Care Teams include your primary Cardiologist (physician) and Advanced Practice Providers (APPs -  Physician Assistants and Nurse Practitioners) who all work together to provide you with the care you need, when you need it.  We recommend signing up for the patient portal called "MyChart".  Sign up information is provided on this After Visit Summary.  MyChart is used to connect with patients for Virtual Visits (Telemedicine).  Patients are able to view lab/test results, encounter notes, upcoming appointments, etc.  Non-urgent messages can be sent to your provider as well.   To learn more about what you can do with MyChart, go to [email or web address].    Your next appointment:   1 year(s)  Provider:   Rollene Rotunda, MD     Other Instructions       Signed, Marcelino Duster, PA  11/01/2023 12:15 PM    [facility]

## 2023-11-01 ENCOUNTER — Ambulatory Visit: Payer: Medicare HMO | Attending: Physician Assistant | Admitting: Physician Assistant

## 2023-11-01 ENCOUNTER — Encounter: Payer: Self-pay | Admitting: Physician Assistant

## 2023-11-01 VITALS — BP 130/80 | HR 62 | Ht 71.0 in | Wt 241.2 lb

## 2023-11-01 DIAGNOSIS — Z951 Presence of aortocoronary bypass graft: Secondary | ICD-10-CM | POA: Diagnosis not present

## 2023-11-01 DIAGNOSIS — E119 Type 2 diabetes mellitus without complications: Secondary | ICD-10-CM | POA: Diagnosis not present

## 2023-11-01 DIAGNOSIS — E785 Hyperlipidemia, unspecified: Secondary | ICD-10-CM | POA: Diagnosis not present

## 2023-11-01 DIAGNOSIS — I10 Essential (primary) hypertension: Secondary | ICD-10-CM

## 2023-11-01 NOTE — Patient Instructions (Signed)
 Medication Instructions:  Your physician recommends that you continue on your current medications as directed. Please refer to the Current Medication list given to you today.    Lab Work: NONE ordered at this time of appointment    Testing/Procedures: NONE ordered at this time of appointment   Follow-Up: At [facility], you and your health needs are our priority.  As part of our continuing mission to provide you with exceptional heart care, we have created designated Provider Care Teams.  These Care Teams include your primary Cardiologist (physician) and Advanced Practice Providers (APPs -  Physician Assistants and Nurse Practitioners) who all work together to provide you with the care you need, when you need it.  We recommend signing up for the patient portal called "MyChart".  Sign up information is provided on this After Visit Summary.  MyChart is used to connect with patients for Virtual Visits (Telemedicine).  Patients are able to view lab/test results, encounter notes, upcoming appointments, etc.  Non-urgent messages can be sent to your provider as well.   To learn more about what you can do with MyChart, go to [email or web address].    Your next appointment:   1 year(s)  Provider:   Rollene Rotunda, MD     Other Instructions

## 2023-12-30 ENCOUNTER — Other Ambulatory Visit: Payer: Self-pay | Admitting: Cardiology

## 2023-12-31 ENCOUNTER — Other Ambulatory Visit: Payer: Self-pay

## 2023-12-31 MED ORDER — ATORVASTATIN CALCIUM 40 MG PO TABS: 40.0000 mg | ORAL_TABLET | Freq: Every day | ORAL | 3 refills | Status: AC

## 2023-12-31 MED ORDER — CARVEDILOL 12.5 MG PO TABS: 12.5000 mg | ORAL_TABLET | Freq: Two times a day (BID) | ORAL | 3 refills | Status: AC

## 2023-12-31 NOTE — Addendum Note (Signed)
 Addended by: Gayleen Kawasaki D on: 12/31/2023 05:28 PM   Modules accepted: Orders

## 2024-06-24 ENCOUNTER — Encounter: Payer: Self-pay | Admitting: Student in an Organized Health Care Education/Training Program

## 2024-06-27 ENCOUNTER — Ambulatory Visit (INDEPENDENT_AMBULATORY_CARE_PROVIDER_SITE_OTHER): Admitting: Student in an Organized Health Care Education/Training Program

## 2024-06-27 ENCOUNTER — Encounter: Payer: Self-pay | Admitting: Student in an Organized Health Care Education/Training Program

## 2024-06-27 VITALS — BP 182/91 | HR 57 | Ht 71.0 in | Wt 239.8 lb

## 2024-06-27 DIAGNOSIS — R399 Unspecified symptoms and signs involving the genitourinary system: Secondary | ICD-10-CM

## 2024-06-27 DIAGNOSIS — G479 Sleep disorder, unspecified: Secondary | ICD-10-CM | POA: Diagnosis not present

## 2024-06-27 DIAGNOSIS — E785 Hyperlipidemia, unspecified: Secondary | ICD-10-CM

## 2024-06-27 DIAGNOSIS — I2510 Atherosclerotic heart disease of native coronary artery without angina pectoris: Secondary | ICD-10-CM

## 2024-06-27 DIAGNOSIS — I10 Essential (primary) hypertension: Secondary | ICD-10-CM

## 2024-06-27 DIAGNOSIS — E119 Type 2 diabetes mellitus without complications: Secondary | ICD-10-CM

## 2024-06-27 DIAGNOSIS — Z7984 Long term (current) use of oral hypoglycemic drugs: Secondary | ICD-10-CM

## 2024-06-27 MED ORDER — TRAZODONE HCL 50 MG PO TABS: 50.0000 mg | ORAL_TABLET | Freq: Every evening | ORAL | 1 refills | Status: AC | PRN

## 2024-06-27 MED ORDER — AMLODIPINE-OLMESARTAN 5-20 MG PO TABS: 1.0000 | ORAL_TABLET | Freq: Every day | ORAL | 3 refills | Status: DC

## 2024-06-27 MED ORDER — TAMSULOSIN HCL 0.4 MG PO CAPS: 0.4000 mg | ORAL_CAPSULE | Freq: Every day | ORAL | 1 refills | Status: AC

## 2024-06-27 NOTE — Assessment & Plan Note (Signed)
 Chronic and stable.  Asymptomatic. He is stable post quadruple bypass surgery with the current medication regimen. Continue aspirin , atorvastatin , and carvedilol .  He had prior angioedema to an ACE inhibitor, I am going to start an ARB given the hypertension that is not controlled.

## 2024-06-27 NOTE — Assessment & Plan Note (Addendum)
 Chronic and stable.  Diagnosed around 2012.  Currently being treated with metformin  500 mg daily.  Tried Mounjaro in the past but cost was prohibitive.  Reviewed his medical records which show that his A1c over the last year has fluctuated between 4.7 and 8.2%.  Will check in again at our next visit.  In 1 month.

## 2024-06-27 NOTE — Assessment & Plan Note (Signed)
 Managed with atorvastatin , kidney function is normal. Continue atorvastatin  and check cholesterol levels at the next visit.

## 2024-06-27 NOTE — Assessment & Plan Note (Signed)
 Patient reports a moderate burden of lower urinary tract symptoms, mostly nocturia bothers him about every 2-3 hours through the night.  Denies any symptoms of urinary obstruction.  Has had normal PSA testing in the past.  Will try tamsulosin for treatment of BPH, follow-up in 1 month.  We talked about the risks of lightheadedness on standing.

## 2024-06-27 NOTE — Assessment & Plan Note (Signed)
 He has difficulty sleeping due to work-related thoughts and previously used trazodone. Restart trazodone and discuss lifestyle modifications for better sleep, including avoiding caffeine in the afternoon, getting exercise, and avoiding screens in the evening.  He does not have symptoms of generalized anxiety disorder.  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  At risk for OSA, but he is not interested in CPAP interventions, so we decided not to do a sleep study.  If the trazodone is not effective could try melatonin or ramelteon.  I recommended against other sedative medications because of the risk of side effects like falls.  I also recommended against over-the-counter antihistamine sleep medications.
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
## 2024-06-27 NOTE — Patient Instructions (Signed)
  VISIT SUMMARY: Today, you came in for reestablishment of care and to discuss your sleep difficulties. We reviewed your history of coronary artery disease, hypertension, and diabetes, and discussed your current medications and lifestyle.  YOUR PLAN: -ESSENTIAL HYPERTENSION: Your blood pressure remains high despite taking amlodipine , which increases your risk for heart problems. We will stop amlodipine  and start you on Azor, which combines amlodipine  and olmesartan. Continue taking carvedilol . Please check your blood pressure twice a week and record the readings. We will follow up in one month to see how your blood pressure is doing.  -ATHEROSCLEROTIC HEART DISEASE, STATUS POST CORONARY ARTERY BYPASS GRAFTING: You have a history of heart disease and had a quadruple bypass surgery. Your condition is stable with your current medications. Continue taking aspirin , atorvastatin , and carvedilol .  -HYPERLIPIDEMIA: You have high cholesterol, which is being managed with atorvastatin . Your kidney function is normal. Continue taking atorvastatin , and we will check your cholesterol levels at your next visit.  -ATRIAL FIBRILLATION: You had an irregular heartbeat after your surgery in 2013. No changes to your current treatment are needed.  -TYPE 2 DIABETES MELLITUS WITHOUT COMPLICATIONS: You have diabetes, which was previously managed with metformin . You tried Mounjaro but stopped due to cost and lack of benefit. No changes to your current treatment are needed.  -INSOMNIA: You have trouble sleeping due to work-related thoughts. We will restart trazodone to help you sleep. Additionally, try to avoid caffeine in the afternoon, get regular exercise, and avoid screens in the evening to improve your sleep.  INSTRUCTIONS: Please check your blood pressure twice a week and record the readings. Follow up in one month to recheck your blood pressure and review your cholesterol levels.

## 2024-06-27 NOTE — Assessment & Plan Note (Addendum)
 Blood pressure remains elevated despite amlodipine , increasing cardiovascular risk. Stop amlodipine  and start Azor (amlodipine  and olmesartan). Continue carvedilol . Check blood pressure twice a week at home and record readings. Follow up in one month to recheck blood pressure.  He has a history of angioedema to lisinopril , ARB should be very low risk for this.  I want to avoid diuretics right now because he has pretty significant lower urinary tract symptoms from BPH.

## 2024-07-04 ENCOUNTER — Ambulatory Visit: Admitting: Internal Medicine

## 2024-07-25 ENCOUNTER — Encounter: Payer: Self-pay | Admitting: Student in an Organized Health Care Education/Training Program

## 2024-07-25 ENCOUNTER — Ambulatory Visit (INDEPENDENT_AMBULATORY_CARE_PROVIDER_SITE_OTHER): Admitting: Student in an Organized Health Care Education/Training Program

## 2024-07-25 VITALS — BP 146/69 | HR 54 | Temp 98.7°F | Ht 71.0 in | Wt 238.6 lb

## 2024-07-25 DIAGNOSIS — I152 Hypertension secondary to endocrine disorders: Secondary | ICD-10-CM

## 2024-07-25 DIAGNOSIS — E785 Hyperlipidemia, unspecified: Secondary | ICD-10-CM

## 2024-07-25 DIAGNOSIS — E119 Type 2 diabetes mellitus without complications: Secondary | ICD-10-CM

## 2024-07-25 DIAGNOSIS — E1159 Type 2 diabetes mellitus with other circulatory complications: Secondary | ICD-10-CM | POA: Diagnosis not present

## 2024-07-25 DIAGNOSIS — R399 Unspecified symptoms and signs involving the genitourinary system: Secondary | ICD-10-CM

## 2024-07-25 DIAGNOSIS — Z7984 Long term (current) use of oral hypoglycemic drugs: Secondary | ICD-10-CM

## 2024-07-25 DIAGNOSIS — G479 Sleep disorder, unspecified: Secondary | ICD-10-CM

## 2024-07-25 DIAGNOSIS — Z1159 Encounter for screening for other viral diseases: Secondary | ICD-10-CM | POA: Diagnosis not present

## 2024-07-25 DIAGNOSIS — I10 Essential (primary) hypertension: Secondary | ICD-10-CM | POA: Diagnosis not present

## 2024-07-25 LAB — LIPID PANEL
Cholesterol: 113 mg/dL (ref 0–200)
HDL: 46.3 mg/dL (ref >39.00)
LDL Cholesterol: 53 mg/dL (ref 0–99)
NonHDL: 67.09
Total CHOL/HDL Ratio: 2
Triglycerides: 70 mg/dL (ref 0.0–149.0)
VLDL: 14 mg/dL (ref 0.0–40.0)

## 2024-07-25 LAB — BASIC METABOLIC PANEL WITH GFR
BUN: 18 mg/dL (ref 6–23)
CO2: 31 meq/L (ref 19–32)
Calcium: 9.8 mg/dL (ref 8.4–10.5)
Chloride: 104 meq/L (ref 96–112)
Creatinine, Ser: 0.79 mg/dL (ref 0.40–1.50)
GFR: 85.09 mL/min (ref >60.00)
Glucose, Bld: 135 mg/dL — ABNORMAL HIGH (ref 70–99)
Potassium: 5 meq/L (ref 3.5–5.1)
Sodium: 140 meq/L (ref 135–145)

## 2024-07-25 LAB — MICROALBUMIN / CREATININE URINE RATIO
Creatinine,U: 94.7 mg/dL
Microalb Creat Ratio: 77.4 mg/g — ABNORMAL HIGH (ref 0.0–30.0)
Microalb, Ur: 7.3 mg/dL — ABNORMAL HIGH (ref 0.0–1.9)

## 2024-07-25 MED ORDER — AMLODIPINE-OLMESARTAN 5-20 MG PO TABS: 2.0000 | ORAL_TABLET | Freq: Every day | ORAL | Status: DC

## 2024-07-25 NOTE — Assessment & Plan Note (Signed)
 A1c is at goal with metformin , indicating low complication risk. Continue metformin  500 mg daily. Check A1c every three months.  Olmesartan  for renal protection.  Using atorvastatin  and aspirin  for secondary prevention of ischemic events.

## 2024-07-25 NOTE — Assessment & Plan Note (Signed)
 Blood pressure remains elevated despite Azor , with no side effects reported. Increase Azor  to two tablets daily. Instructed home blood pressure monitoring twice weekly and record readings. Order labs for kidney function and electrolytes.  He is a robust older man with history of heart disease, so goal blood pressure would be less than 130/80.

## 2024-07-25 NOTE — Assessment & Plan Note (Signed)
 Trazodone  is effective for sleep with no side effects. Continue Trazodone  50 mg at bedtime as needed.

## 2024-07-25 NOTE — Progress Notes (Signed)
 Established Patient Office Visit  Patient ID: Daniel [name], male    DOB: 1945/10/09  Age: 78 y.o. MRN: [id number] PCP: Jerrell Cleatus Ned, MD  Chief Complaint  Patient presents with   Hypertension    Pt feels well, no concerns    Subjective:     HPI  Discussed the use of AI scribe software for clinical note transcription with the patient, who gave verbal consent to proceed.  History of Present Illness Daniel [name] is a 78 year old male with hypertension and diabetes who presents for follow-up of blood pressure management and diabetes control.  He has been experiencing elevated blood pressure readings at home, with recent measurements of 146/79 mmHg and 166 mmHg. He has been monitoring his blood pressure intermittently over the past month. He recently switched from amlodipine  to Azor , a combination medication, but feels it is not adequately controlling his blood pressure. He is currently taking Azor  once daily without any side effects.  He has a history of diabetes, with his last A1c recorded at 8.2%. He recalls a previous A1c of 4.9% and notes fluctuations between these values. He is currently on metformin  500 mg daily. His A1c today is 7.0%.  He reports issues with urination and started taking Flomax . He takes it at night and notes improvement, as he now wakes up twice at night to urinate, compared to three or four times previously. He does not experience frequent urination during the day. 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     Objective:     BP (!) 146/69   Pulse (!) 54   Temp 98.7 F (37.1 C) (Temporal)   Ht 5' 11 (1.803 m)   Wt 238 lb 9.6 oz (108.2 kg)   SpO2 100%   BMI 33.28 kg/m   Physical Exam  Gen: Well-appearing man Neck: Normal thyroid, no nodules or adenopathy Heart: Regular, no murmur Lungs: Unlabored, clear throughout Ext: Warm, trace nonpitting edema in both legs    Assessment &  Plan:   Problem List Items Addressed This Visit       High   Hypertension associated with diabetes (HCC) - Primary (Chronic)   Blood pressure remains elevated despite Azor , with no side effects reported. Increase Azor  to two tablets daily. Instructed home blood pressure monitoring twice weekly and record readings. Order labs for kidney function and electrolytes.  He is a robust older man with history of heart disease, so goal blood pressure would be less than 130/80.      Relevant Medications   amLODipine -olmesartan  (AZOR ) 5-20 MG tablet   Other Relevant Orders   Microalbumin / creatinine urine ratio   Diabetes mellitus type 2, noninsulin dependent (HCC) (Chronic)   A1c is at goal with metformin , indicating low complication risk. Continue metformin  500 mg daily. Check A1c every three months.  Olmesartan  for renal protection.  Using atorvastatin  and aspirin  for secondary prevention of ischemic events.      Relevant Medications   amLODipine -olmesartan  (AZOR ) 5-20 MG tablet   Other Relevant Orders   Microalbumin / creatinine urine ratio     Medium    Sleeping difficulty (Chronic)   Trazodone  is effective for sleep with no side effects. Continue Trazodone  50 mg at bedtime as needed.      Lower urinary tract symptoms (LUTS) (Chronic)   Symptoms have improved with Tamsulosin , reducing nocturia. Continue Tamsulosin  0.4 mg daily.      HLD (hyperlipidemia) (Chronic)  Relevant Medications   amLODipine -olmesartan  (AZOR ) 5-20 MG tablet   Other Relevant Orders   Lipid panel   Other Visit Diagnoses       Encounter for HCV screening test for low risk patient       Relevant Orders   Hepatitis C antibody       Return in about 6 weeks (around 09/05/2024) for HTN management.    Cleatus Debby Specking, MD [facility] at [facility]

## 2024-07-25 NOTE — Patient Instructions (Signed)
  VISIT SUMMARY: Today, you came in for a follow-up visit to manage your blood pressure and diabetes. We discussed your recent blood pressure readings, your current medications, and your experience with urination issues. We also reviewed your diabetes control and sleep quality.  YOUR PLAN: -ESSENTIAL HYPERTENSION: Essential hypertension means high blood pressure without a known cause. Your blood pressure is still high despite taking Azor . We will increase your dose to two tablets daily. Please monitor your blood pressure at home twice a week and record the readings. We will also check your kidney function and electrolytes with a lab test.  -TYPE 2 DIABETES MELLITUS: Type 2 diabetes is a condition where your body does not use insulin  properly, leading to high blood sugar levels. Your A1c level, which measures your average blood sugar over the past three months, is at a good level with your current medication, metformin . Continue taking metformin  500 mg daily, and we will check your A1c every three months.  -LOWER URINARY TRACT SYMPTOMS: Lower urinary tract symptoms include issues with urination. Your symptoms have improved with Tamsulosin , which helps to relax the muscles in the prostate and bladder neck, making it easier to urinate. Continue taking Tamsulosin  0.4 mg daily.  -SLEEPING DIFFICULTY: Sleeping difficulty refers to trouble falling or staying asleep. Trazodone  has been effective in helping you sleep without any side effects. Continue taking Trazodone  50 mg at bedtime as needed.  INSTRUCTIONS: Please follow up in three months to recheck your blood pressure and A1c levels. Continue to monitor your blood pressure at home twice a week and bring the readings to your next appointment. We will also review the results of your kidney function and electrolyte tests at your next visit.

## 2024-07-25 NOTE — Assessment & Plan Note (Signed)
 Symptoms have improved with Tamsulosin , reducing nocturia. Continue Tamsulosin  0.4 mg daily.

## 2024-07-26 LAB — HEPATITIS C ANTIBODY: Hepatitis C Ab: NONREACTIVE

## 2024-07-28 ENCOUNTER — Ambulatory Visit: Payer: Self-pay | Admitting: Student in an Organized Health Care Education/Training Program

## 2024-09-12 ENCOUNTER — Encounter: Payer: Self-pay | Admitting: Student in an Organized Health Care Education/Training Program

## 2024-09-12 ENCOUNTER — Ambulatory Visit: Admitting: Student in an Organized Health Care Education/Training Program

## 2024-09-12 VITALS — BP 136/77 | HR 62 | Wt 252.0 lb

## 2024-09-12 DIAGNOSIS — Z7985 Long-term (current) use of injectable non-insulin antidiabetic drugs: Secondary | ICD-10-CM | POA: Diagnosis not present

## 2024-09-12 DIAGNOSIS — E291 Testicular hypofunction: Secondary | ICD-10-CM

## 2024-09-12 DIAGNOSIS — Z125 Encounter for screening for malignant neoplasm of prostate: Secondary | ICD-10-CM | POA: Diagnosis not present

## 2024-09-12 DIAGNOSIS — E119 Type 2 diabetes mellitus without complications: Secondary | ICD-10-CM

## 2024-09-12 DIAGNOSIS — I152 Hypertension secondary to endocrine disorders: Secondary | ICD-10-CM

## 2024-09-12 DIAGNOSIS — E1159 Type 2 diabetes mellitus with other circulatory complications: Secondary | ICD-10-CM

## 2024-09-12 LAB — PSA: PSA: 0.4 ng/mL (ref 0.10–4.00)

## 2024-09-12 LAB — CBC
HCT: 41.3 % (ref 39.0–52.0)
Hemoglobin: 14 g/dL (ref 13.0–17.0)
MCHC: 34 g/dL (ref 30.0–36.0)
MCV: 93.2 fl (ref 78.0–100.0)
Platelets: 147 K/uL — ABNORMAL LOW (ref 150.0–400.0)
RBC: 4.44 Mil/uL (ref 4.22–5.81)
RDW: 13.7 % (ref 11.5–15.5)
WBC: 8 K/uL (ref 4.0–10.5)

## 2024-09-12 LAB — TESTOSTERONE: Testosterone: 35.91 ng/dL — ABNORMAL LOW (ref 300.00–890.00)

## 2024-09-12 LAB — HEMOGLOBIN A1C: Hgb A1c MFr Bld: 7.5 % — ABNORMAL HIGH (ref 4.6–6.5)

## 2024-09-12 MED ORDER — OZEMPIC (0.25 OR 0.5 MG/DOSE) 2 MG/3ML ~~LOC~~ SOPN: 0.2500 mg | PEN_INJECTOR | SUBCUTANEOUS | 2 refills | Status: AC

## 2024-09-12 MED ORDER — AMLODIPINE-OLMESARTAN 10-40 MG PO TABS: 1.0000 | ORAL_TABLET | Freq: Every day | ORAL | 3 refills | Status: AC

## 2024-09-12 NOTE — Patient Instructions (Signed)
" °  VISIT SUMMARY: During your visit, we discussed your concerns about low testosterone , erectile dysfunction, diabetes, hypertension, and weight management. We reviewed your symptoms, current medications, and lifestyle habits. We also discussed potential treatments and lifestyle changes to help manage your conditions and improve your overall health.  YOUR PLAN: -ERECTILE DYSFUNCTION AND HYPOGONADISM: Erectile dysfunction is the inability to achieve or maintain an erection, and hypogonadism is a condition where the body doesn't produce enough testosterone . We discussed the benefits and limitations of testosterone  replacement therapy and ordered tests to check your testosterone  and PSA levels. If your testosterone  is low, we may start testosterone  replacement therapy.  -TYPE 2 DIABETES MELLITUS: Type 2 diabetes is a condition where the body doesn't use insulin  properly, leading to high blood sugar levels. Your previous A1c was 7.0%. We discussed weight management and prescribed Ozempic , starting at 0.25 mg weekly, as an alternative to Mounjaro. We will check your insurance coverage for this medication.  -ESSENTIAL HYPERTENSION: Hypertension is high blood pressure. Your blood pressure is well-controlled with your current medication regimen, which you recently increased to twice daily. Continue with your current medication regimen.  -OBESITY: Obesity is a condition where excess body fat negatively affects your health. We discussed the benefits of Ozempic  for weight management and prescribed it to help with weight loss. We also encouraged you to continue with lifestyle modifications, including diet and exercise.  -GENERAL HEALTH MAINTENANCE: We discussed the importance of regular exercise and a healthy diet. You mentioned that walking is your primary exercise, and you plan to resume it. We encouraged you to continue walking and make dietary modifications to support weight loss.  INSTRUCTIONS: Please follow  up with the recommended testosterone  and PSA tests. Continue with your current blood pressure medication regimen and start taking Ozempic  as prescribed. Maintain your exercise routine and dietary modifications to support weight loss. We will check your insurance coverage for Ozempic  and discuss the results at your next appointment.  "

## 2024-09-12 NOTE — Assessment & Plan Note (Signed)
 His blood pressure is well-controlled with the current medication regimen, which was recently increased to twice daily. Continue current blood pressure medication regimen.

## 2024-09-12 NOTE — Progress Notes (Signed)
 "  Established Patient Office Visit  Patient ID: Daniel [name], male    DOB: 07/07/1946  Age: 79 y.o. MRN: [id number] PCP: Jerrell Cleatus Ned, MD  Chief Complaint  Patient presents with   Hypertension    6 week follow up     Subjective:     HPI  Discussed the use of AI scribe software for clinical note transcription with the patient, who gave verbal consent to proceed.  History of Present Illness Daniel [name] Arvella is a 79 year old male with diabetes and hypertension who presents for evaluation of low testosterone  and erectile dysfunction.  He is experiencing symptoms of decreased energy, reduced libido, and erectile dysfunction, including penile shrinkage and inability to achieve an erection. He recalls a previous lab result indicating low testosterone  but has not been on testosterone  replacement therapy before.  He reports blood pressure readings in the 140s and recently increased his blood pressure medication from once to twice daily. He recently increased his blood pressure medication from once to twice daily without experiencing side effects like lightheadedness.  He manages diabetes and was previously on Mounjaro for one month but discontinued due to high cost. He is frustrated with weight management despite eating primarily salads and has started walking again to lose weight before an upcoming appointment.   He spent the holidays in [place] with family and has resumed walking for exercise. He expresses a desire to live to [age] and maintain good health. He reports no history of high PSA levels.    Objective:     BP 136/77   Pulse 62   Wt 252 lb (114.3 kg)   SpO2 99%   BMI 35.15 kg/m   Physical Exam  Gen: Well-appearing man Neck: Normal thyroid, no nodules or adenopathy Heart: Regular, no murmur Lungs: Unlabored, clear throughout Ext: Warm, trace edema in both lower extremities   Assessment & Plan:   Problem List Items Addressed This  Visit       High   Hypertension associated with diabetes (HCC) - Primary (Chronic)   His blood pressure is well-controlled with the current medication regimen, which was recently increased to twice daily. Continue current blood pressure medication regimen.      Relevant Medications   Semaglutide ,0.25 or 0.5MG /DOS, (OZEMPIC , 0.25 OR 0.5 MG/DOSE,) 2 MG/3ML SOPN   amLODipine -olmesartan  (AZOR ) 10-40 MG tablet   Diabetes mellitus type 2, noninsulin dependent (HCC) (Chronic)   His previous A1c was 7.0%. We discussed weight management and GLP-1 receptor agonists.  Given his history of ischemic heart disease and quadruple bypass, I think a GLP-1 agonist would be very beneficial for him.  He did not continue the previous Mounjaro trial due to cost. Prescribed Ozempic  starting at 0.25 mg weekly.      Relevant Medications   Semaglutide ,0.25 or 0.5MG /DOS, (OZEMPIC , 0.25 OR 0.5 MG/DOSE,) 2 MG/3ML SOPN   amLODipine -olmesartan  (AZOR ) 10-40 MG tablet   Other Relevant Orders   Hemoglobin A1c     Medium    Primary testicular failure (Chronic)   He reports decreased energy, libido, and erectile dysfunction, with a history of low testosterone . He declined erectile dysfunction medications due to ineffectiveness. We discussed the benefits and limitations of testosterone  replacement therapy. Ordered testosterone  and PSA tests. Potential initiation of testosterone  replacement therapy if testosterone  is low.      Relevant Orders   Testosterone    CBC   Other Visit Diagnoses       Screening for malignant neoplasm of prostate  Relevant Orders   PSA       Return in about 3 months (around 12/11/2024).    Cleatus Debby Specking, MD [facility]   "

## 2024-09-12 NOTE — Assessment & Plan Note (Signed)
 He reports decreased energy, libido, and erectile dysfunction, with a history of low testosterone . He declined erectile dysfunction medications due to ineffectiveness. We discussed the benefits and limitations of testosterone  replacement therapy. Ordered testosterone  and PSA tests. Potential initiation of testosterone  replacement therapy if testosterone  is low.

## 2024-09-12 NOTE — Assessment & Plan Note (Signed)
 His previous A1c was 7.0%. We discussed weight management and GLP-1 receptor agonists.  Given his history of ischemic heart disease and quadruple bypass, I think a GLP-1 agonist would be very beneficial for him.  He did not continue the previous Mounjaro trial due to cost. Prescribed Ozempic  starting at 0.25 mg weekly.

## 2024-09-15 ENCOUNTER — Ambulatory Visit: Payer: Self-pay | Admitting: Student in an Organized Health Care Education/Training Program

## 2024-09-15 DIAGNOSIS — E291 Testicular hypofunction: Secondary | ICD-10-CM

## 2024-09-15 NOTE — Progress Notes (Signed)
Called patient and scheduled lab appt

## 2024-09-16 ENCOUNTER — Ambulatory Visit: Payer: Self-pay | Admitting: Student in an Organized Health Care Education/Training Program

## 2024-09-16 ENCOUNTER — Other Ambulatory Visit

## 2024-09-16 DIAGNOSIS — E291 Testicular hypofunction: Secondary | ICD-10-CM

## 2024-09-16 LAB — TESTOSTERONE: Testosterone: 38.84 ng/dL — ABNORMAL LOW (ref 300.00–890.00)

## 2024-09-16 MED ORDER — TESTOSTERONE 1.62 % TD GEL: 40.5000 mg | Freq: Every day | TRANSDERMAL | 0 refills | Status: AC

## 2024-09-17 ENCOUNTER — Telehealth: Payer: Self-pay

## 2024-09-17 ENCOUNTER — Other Ambulatory Visit (HOSPITAL_COMMUNITY): Payer: Self-pay

## 2024-09-17 NOTE — Telephone Encounter (Signed)
 Pharmacy Patient Advocate Encounter   Received notification from Physician's Office that prior authorization for Testosterone  20.25 MG/1.25GM(1.62%) gel is required/requested.   Insurance verification completed.   The patient is insured through [employer].   Per test claim: PA required; PA submitted to above mentioned insurance via Latent Key/confirmation #/EOC [id number] Status is pending

## 2024-09-18 ENCOUNTER — Other Ambulatory Visit (HOSPITAL_COMMUNITY): Payer: Self-pay

## 2024-09-18 NOTE — Telephone Encounter (Signed)
 Pharmacy Patient Advocate Encounter  Received notification from [employer] that Prior Authorization for Testosterone  20.25 MG/1.25GM(1.62%) gel  has been APPROVED from 09/04/24 to 09/03/25. Ran test claim, Copay is $46.43. This test claim was processed through [facility]- copay amounts may vary at other pharmacies due to pharmacy/plan contracts, or as the patient moves through the different stages of their insurance plan.   PA #/Case ID/Reference #: [id number]

## 2024-10-06 ENCOUNTER — Ambulatory Visit: Admitting: Cardiology

## 2024-10-31 ENCOUNTER — Ambulatory Visit: Admitting: Cardiology

## 2024-12-12 ENCOUNTER — Ambulatory Visit: Admitting: Student in an Organized Health Care Education/Training Program
